# Patient Record
Sex: Female | Born: 1960 | Race: White | Hispanic: No | Marital: Married | Smoking: Never smoker
Health system: Southern US, Community
[De-identification: ages and names within clinical notes are randomized; demographics above are authoritative.]

## PROBLEM LIST (undated history)

## (undated) DIAGNOSIS — R51 Headache: Secondary | ICD-10-CM

## (undated) DIAGNOSIS — I10 Essential (primary) hypertension: Secondary | ICD-10-CM

---

## 2006-07-07 ENCOUNTER — Ambulatory Visit (HOSPITAL_COMMUNITY): Admission: RE | Admit: 2006-07-07 | Discharge: 2006-07-07 | Payer: Self-pay | Admitting: Family Medicine

## 2006-07-14 ENCOUNTER — Encounter: Admission: RE | Admit: 2006-07-14 | Discharge: 2006-07-14 | Payer: Self-pay | Admitting: Family Medicine

## 2007-06-12 IMAGING — MG MM DIAGNOSTIC UNILATERAL L
2 series · 2 of 2 positions shown · non-contrast
Comparison: none

DG DIAGNOSTIC UNILATERAL L
CC and MLO view(s) were taken of the left breast.

LEFT BREAST ULTRASOUND
DIGITAL UNILATERAL LEFT DIAGNOSTIC MAMMOGRAM AND LEFT BREAST ULTRASOUND:
CLINICAL DATA: The patient returns for evaluation of a possible mass in the left breast noted on 
recent screening study of 07-07-06 at [HOSPITAL].

[L CC]
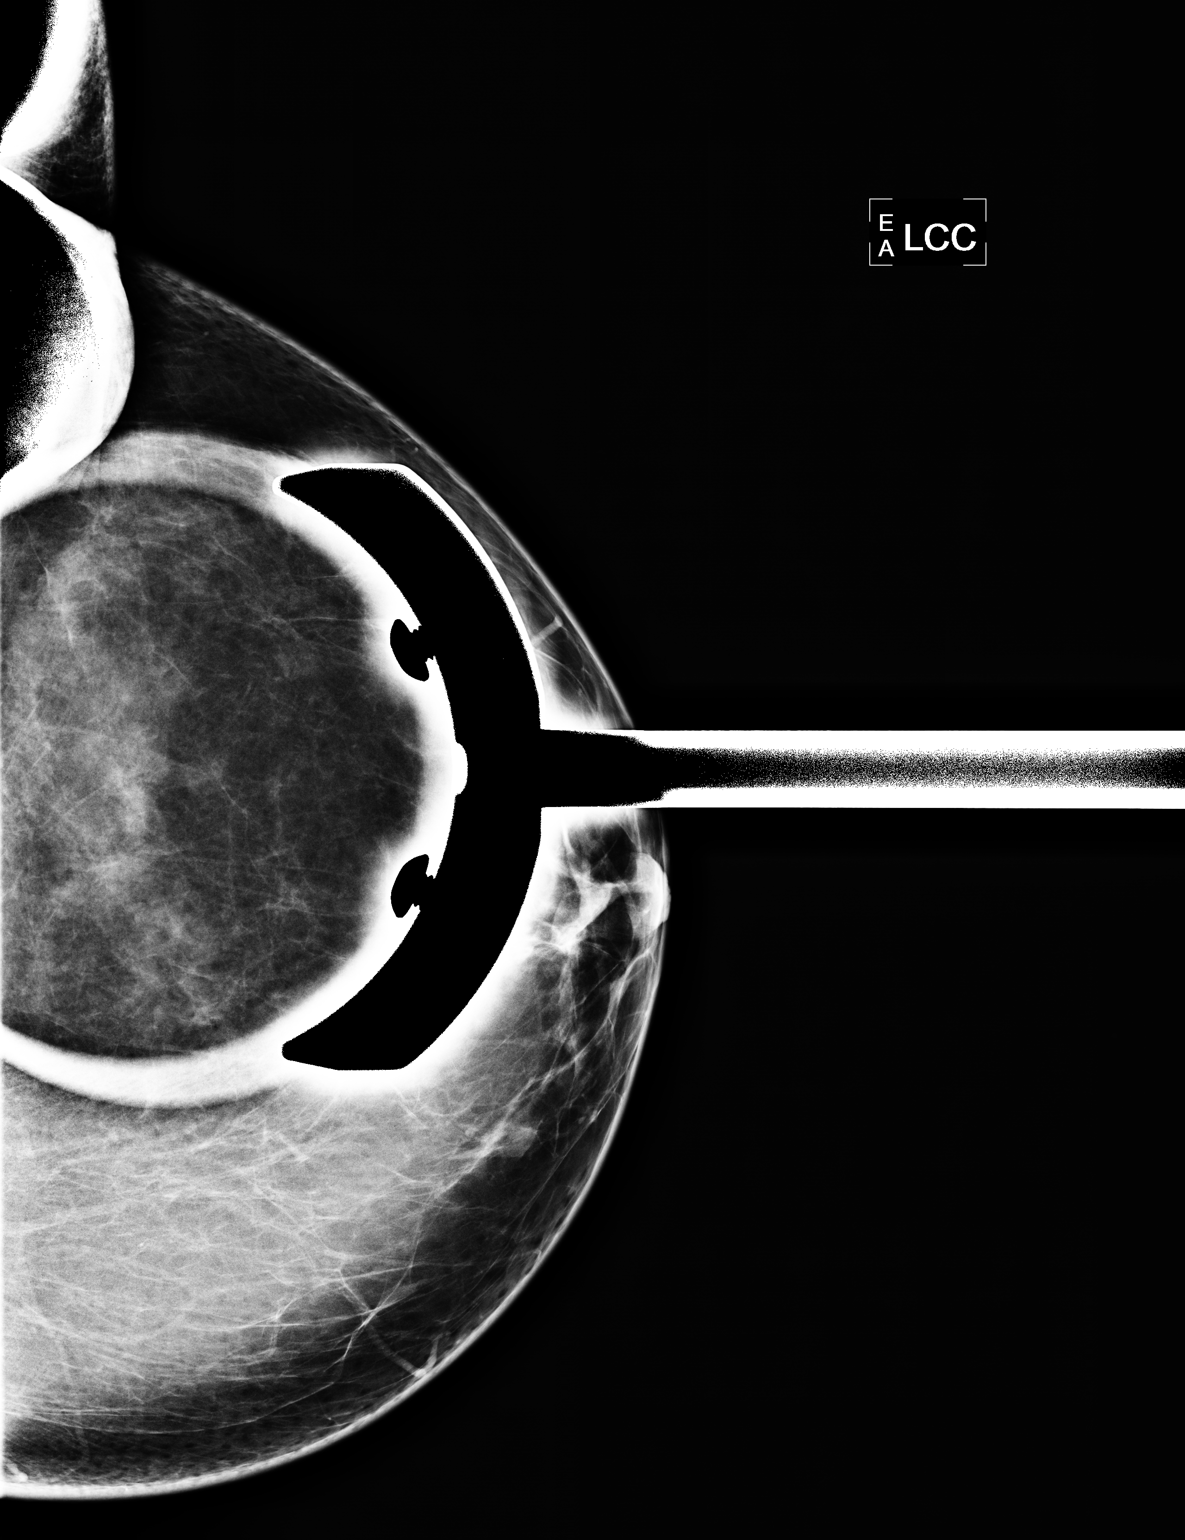

[L MLO]
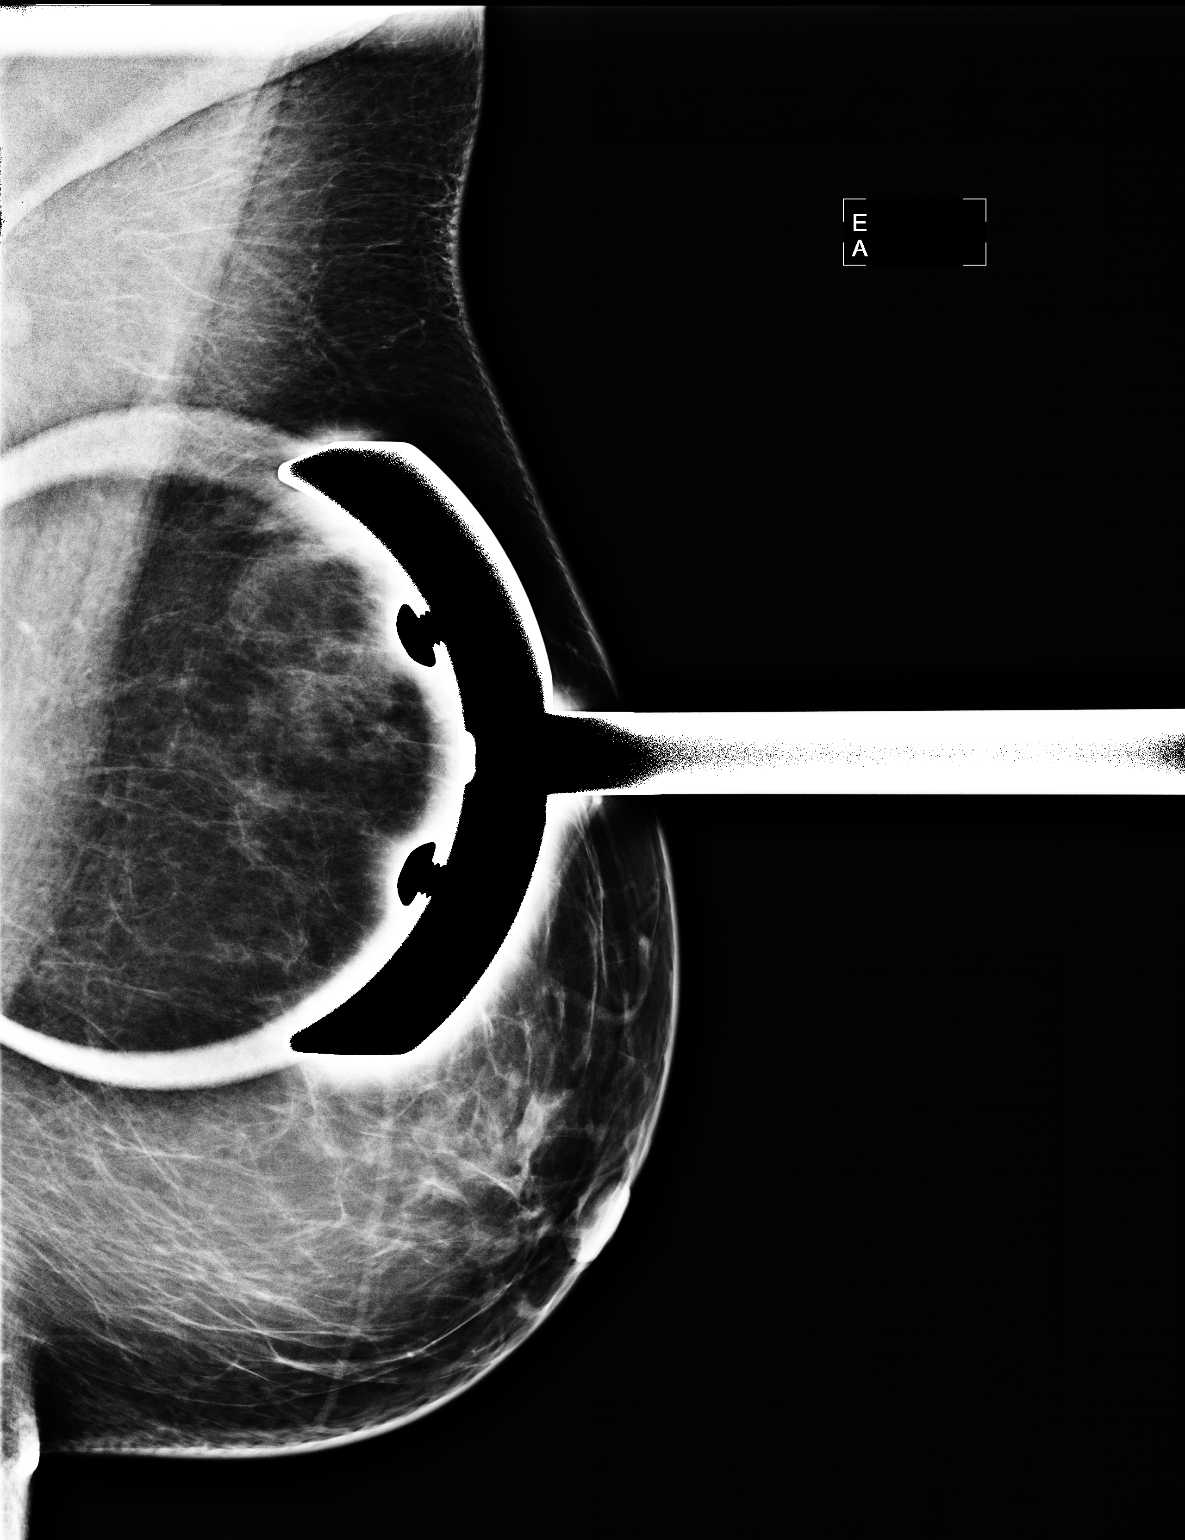

[2 of 2 positions shown; findings below may reference images not displayed]

Spot compression views of the upper outer quadrant of the left breast posteriorly were obtained 
demonstrating no persistent mass or distortion.

On physical examination, no mass is palpated in the outer portion of the left breast.  Sonography 
demonstrates mixed fibroglandular tissue and fat with no mass, distortion or shadowing to suggest 
malignancy.
IMPRESSION: No persistent worrisome abnormality upon additional imaging of the left breast.  Yearly screening 
is suggested.

ASSESSMENT: Negative - BI-RADS 1

Screening mammogram of both breasts in 1 year.
,

## 2008-01-11 ENCOUNTER — Ambulatory Visit (HOSPITAL_COMMUNITY): Admission: RE | Admit: 2008-01-11 | Discharge: 2008-01-11 | Payer: Self-pay | Admitting: Family Medicine

## 2011-01-02 ENCOUNTER — Encounter: Payer: Self-pay | Admitting: Family Medicine

## 2011-03-31 ENCOUNTER — Other Ambulatory Visit (HOSPITAL_COMMUNITY): Payer: Self-pay | Admitting: Family Medicine

## 2011-03-31 DIAGNOSIS — Z139 Encounter for screening, unspecified: Secondary | ICD-10-CM

## 2011-04-07 ENCOUNTER — Ambulatory Visit (HOSPITAL_COMMUNITY): Payer: Self-pay

## 2012-03-29 ENCOUNTER — Other Ambulatory Visit: Payer: Self-pay | Admitting: Family Medicine

## 2012-03-29 DIAGNOSIS — Z139 Encounter for screening, unspecified: Secondary | ICD-10-CM

## 2012-04-02 ENCOUNTER — Ambulatory Visit (HOSPITAL_COMMUNITY)
Admission: RE | Admit: 2012-04-02 | Discharge: 2012-04-02 | Disposition: A | Discharge status: Home / Self Care | Payer: BC Managed Care – PPO | Source: Ambulatory Visit | Attending: Family Medicine | Admitting: Family Medicine

## 2012-04-02 DIAGNOSIS — Z1231 Encounter for screening mammogram for malignant neoplasm of breast: Secondary | ICD-10-CM | POA: Insufficient documentation

## 2012-04-02 DIAGNOSIS — Z139 Encounter for screening, unspecified: Secondary | ICD-10-CM

## 2012-04-10 NOTE — H&P (Signed)
  NTS SOAP Note  Vital Signs:  Vitals as of: 04/10/2012: Systolic 145: Diastolic 82: Heart Rate 73: Temp 67F: Height 58ft 7in: Weight 170Lbs 0 Ounces: OFC Not Entered: Respiratory Rate Not Entered: O2 Saturation Not Entered: Pain Level Not Entered: BMI 27  BMI : 26.63 kg/m2  Subjective: This 51 Years 2 Months old Female presents forscreening TCS.  Never has had a colonoscopy.  No family h/o colon cancer.  Denies GI complaints.  Review of Symptoms:  Constitutional:unremarkable Head:unremarkable Eyes:unremarkable Nose/Mouth/Throat:unremarkable Cardiovascular:unremarkable Respiratory:unremarkable Gastrointestinal:unremarkable Genitourinary:unremarkable Musculoskeletal:unremarkable Skin:unremarkable Hematolgic/Lymphatic:unremarkable Allergic/Immunologic:unremarkable   Past Medical History:Reviewed   Past Medical History  Surgical History: unremarkable Medical Problems:  High Blood pressure Allergies: nkda Medications: enalapril, imipranide   Social History:Reviewed  Social History  Preferred Language: English ([place]) Race:  White Ethnicity: Not Hispanic / Latino Age: 51 Years 2 Months Marital Status:  M Alcohol:  No Recreational drug(s):  No   Smoking Status: Never smoker reviewed on 04/10/2012  Family History:Reviewed   Family History  Is there a family history of:CAD, pancreatic cancer.  No family h/o colon cancer    Objective Information: General:Well appearing, well nourished in no distress. Head:Atraumatic; no masses; no abnormalities Neck:Supple without lymphadenopathy.  Heart:RRR, no murmur or gallop.  Normal S1, S2.  No S3, S4.  Lungs:CTA bilaterally, no wheezes, rhonchi, rales.  Breathing unlabored. Abdomen:Soft, NT/ND, no HSM, no masses. deferred to procedure  Assessment:Need for screening TCS  Diagnosis &amp; Procedure: DiagnosisCode: V76.51, ProcedureCode:  10272,    Plan:Schedule for TCS on 04/24/12.   Patient Education:Alternative treatments to surgery were discussed with patient (and family).Risks and benefits  of procedure were fully explained to the patient (and family) who gave informed consent. Patient/family questions were addressed.  Follow-up:Pending Surgery

## 2012-04-19 ENCOUNTER — Encounter (HOSPITAL_COMMUNITY): Payer: Self-pay

## 2012-04-23 MED ORDER — SODIUM CHLORIDE 0.45 % IV SOLN
Freq: Once | INTRAVENOUS | Status: AC
  Administered 2012-04-24: 1000 mL via INTRAVENOUS

## 2012-04-24 ENCOUNTER — Encounter (HOSPITAL_COMMUNITY)
Admission: RE | Disposition: A | Discharge status: Home / Self Care | Payer: Self-pay | Source: Ambulatory Visit | Attending: General Surgery

## 2012-04-24 ENCOUNTER — Ambulatory Visit (HOSPITAL_COMMUNITY)
Admission: RE | Admit: 2012-04-24 | Discharge: 2012-04-24 | Disposition: A | Discharge status: Home / Self Care | Payer: BC Managed Care – PPO | Source: Ambulatory Visit | Attending: General Surgery | Admitting: General Surgery

## 2012-04-24 ENCOUNTER — Encounter (HOSPITAL_COMMUNITY): Payer: Self-pay | Admitting: *Deleted

## 2012-04-24 DIAGNOSIS — I10 Essential (primary) hypertension: Secondary | ICD-10-CM | POA: Insufficient documentation

## 2012-04-24 DIAGNOSIS — Z1211 Encounter for screening for malignant neoplasm of colon: Secondary | ICD-10-CM | POA: Insufficient documentation

## 2012-04-24 DIAGNOSIS — Z79899 Other long term (current) drug therapy: Secondary | ICD-10-CM | POA: Insufficient documentation

## 2012-04-24 HISTORY — PX: COLONOSCOPY: SHX5424

## 2012-04-24 HISTORY — DX: Headache: R51

## 2012-04-24 HISTORY — DX: Essential (primary) hypertension: I10

## 2012-04-24 SURGERY — COLONOSCOPY
Anesthesia: Moderate Sedation

## 2012-04-24 MED ORDER — MEPERIDINE HCL 100 MG/ML IJ SOLN
INTRAMUSCULAR | Status: AC
  Filled 2012-04-24: qty 1

## 2012-04-24 MED ORDER — STERILE WATER FOR IRRIGATION IR SOLN
Status: DC | PRN
  Administered 2012-04-24: 09:00:00

## 2012-04-24 MED ORDER — MIDAZOLAM HCL 5 MG/5ML IJ SOLN
INTRAMUSCULAR | Status: DC | PRN
  Administered 2012-04-24: 1 mg via INTRAVENOUS
  Administered 2012-04-24: 2 mg via INTRAVENOUS

## 2012-04-24 MED ORDER — MEPERIDINE HCL 25 MG/ML IJ SOLN
INTRAMUSCULAR | Status: DC | PRN
  Administered 2012-04-24: 50 mg via INTRAVENOUS

## 2012-04-24 MED ORDER — MIDAZOLAM HCL 5 MG/5ML IJ SOLN
INTRAMUSCULAR | Status: AC
  Filled 2012-04-24: qty 10

## 2012-04-24 NOTE — Interval H&P Note (Signed)
History and Physical Interval Note:  04/24/2012 8:38 AM  [name]  has presented today for surgery, with the diagnosis of Special screening for malignant neoplasms, colon  The various methods of treatment have been discussed with the patient and family. After consideration of risks, benefits and other options for treatment, the patient has consented to  Procedure(s) (LRB): COLONOSCOPY (N/A) as a surgical intervention .  The patients' history has been reviewed, patient examined, no change in status, stable for surgery.  I have reviewed the patients' chart and labs.  Questions were answered to the patient's satisfaction.     Renee Berger A

## 2012-04-24 NOTE — Op Note (Signed)
[facility] [address] [place], [place]  [address]  COLONOSCOPY PROCEDURE REPORT  PATIENT:  Renee Berger, Renee Berger  MR#:  [id number] BIRTHDATE:  1961/02/15, 51 yrs. old  GENDER:  female ENDOSCOPIST:  Franky Macho, MD REF. BY:  Simone Curia, M.D. PROCEDURE DATE:  04/24/2012 PROCEDURE:  Average-risk screening colonoscopy G0121 ASA CLASS:  Class II INDICATIONS:  Screening MEDICATIONS:   Versed 3 mg IV, demerol 50 mg IV  DESCRIPTION OF PROCEDURE:   After the risks benefits and alternatives of the procedure were thoroughly explained, informed consent was obtained.  Digital rectal exam was performed and revealed no abnormalities.   The EC-[id number] ([id number]) endoscope was introduced through the anus and advanced to the cecum, which was identified by both the appendix and ileocecal valve, without limitations.  The quality of the prep was adequate..  The instrument was then slowly withdrawn as the colon was fully examined.  FINDINGS:  A normal appearing cecum, ileocecal valve, and appendiceal orifice were identified. The ascending, hepatic flexure, transverse, splenic flexure, descending, sigmoid colon, and rectum appeared unremarkable.   Retroflexed views in the rectum revealed no abnormalities.   The scope was then withdrawn from the cecum and the procedure completed. COMPLICATIONS:  None ENDOSCOPIC IMPRESSION: 1) Normal colon RECOMMENDATIONS:  REPEAT EXAM:  In 10 year(s) for Colonoscopy.  ______________________________ Franky Macho, MD  CC:  Simone Curia, Md  n. Rosalie DoctorFranky Macho at 04/24/2012 08:57 AM  Fannie Knee, [phone]

## 2012-04-24 NOTE — Discharge Instructions (Signed)
Colonoscopy Care After Read the instructions outlined below and refer to this sheet in the next few weeks. These discharge instructions provide you with general information on caring for yourself after you leave the hospital. Your doctor may also give you specific instructions. While your treatment has been planned according to the most current medical practices available, unavoidable complications occasionally occur. If you have any problems or questions after discharge, call your doctor. HOME CARE INSTRUCTIONS ACTIVITY:  You may resume your regular activity, but move at a slower pace for the next 24 hours.   Take frequent rest periods for the next 24 hours.   Walking will help get rid of the air and reduce the bloated feeling in your belly (abdomen).   No driving for 24 hours (because of the medicine (anesthesia) used during the test).   You may shower.   Do not sign any important legal documents or operate any machinery for 24 hours (because of the anesthesia used during the test).  NUTRITION:  Drink plenty of fluids.   You may resume your normal diet as instructed by your doctor.   Begin with a light meal and progress to your normal diet. Heavy or fried foods are harder to digest and may make you feel sick to your stomach (nauseated).   Avoid alcoholic beverages for 24 hours or as instructed.  MEDICATIONS:  You may resume your normal medications unless your doctor tells you otherwise.  WHAT TO EXPECT TODAY:  Some feelings of bloating in the abdomen.   Passage of more gas than usual.   Spotting of blood in your stool or on the toilet paper.  IF YOU HAD POLYPS REMOVED DURING THE COLONOSCOPY:  No aspirin products for 7 days or as instructed.   No alcohol for 7 days or as instructed.   Eat a soft diet for the next 24 hours.  FINDING OUT THE RESULTS OF YOUR TEST Not all test results are available during your visit. If your test results are not back during the visit, make an  appointment with your caregiver to find out the results. Do not assume everything is normal if you have not heard from your caregiver or the medical facility. It is important for you to follow up on all of your test results.  SEEK IMMEDIATE MEDICAL CARE IF:  You have more than a spotting of blood in your stool.   Your belly is swollen (abdominal distention).   You are nauseated or vomiting.   You have a fever.   You have abdominal pain or discomfort that is severe or gets worse throughout the day.  Document Released: 07/12/2004 Document Revised: 11/17/2011 Document Reviewed: 07/10/2008 ExitCare Patient Information 2012 [employer]. 

## 2012-04-27 ENCOUNTER — Encounter (HOSPITAL_COMMUNITY): Payer: Self-pay | Admitting: General Surgery

## 2012-07-14 ENCOUNTER — Emergency Department (HOSPITAL_COMMUNITY)
Admission: EM | Admit: 2012-07-14 | Discharge: 2012-07-14 | Disposition: A | Discharge status: Home / Self Care | Payer: BC Managed Care – PPO | Attending: Emergency Medicine | Admitting: Emergency Medicine

## 2012-07-14 ENCOUNTER — Encounter (HOSPITAL_COMMUNITY): Payer: Self-pay | Admitting: *Deleted

## 2012-07-14 DIAGNOSIS — M62838 Other muscle spasm: Secondary | ICD-10-CM

## 2012-07-14 DIAGNOSIS — I10 Essential (primary) hypertension: Secondary | ICD-10-CM | POA: Insufficient documentation

## 2012-07-14 DIAGNOSIS — M549 Dorsalgia, unspecified: Secondary | ICD-10-CM

## 2012-07-14 MED ORDER — DIAZEPAM 5 MG PO TABS: 2.5000 mg | ORAL_TABLET | Freq: Two times a day (BID) | ORAL | Status: AC | PRN

## 2012-07-14 MED ORDER — DIAZEPAM 2 MG PO TABS
2.0000 mg | ORAL_TABLET | Freq: Once | ORAL | Status: AC
  Administered 2012-07-14: 2 mg via ORAL
  Filled 2012-07-14: qty 1

## 2012-07-14 NOTE — ED Provider Notes (Signed)
History   This chart was scribed for [name], MD by Shari Heritage. The patient was seen in room APA19/APA19. Patient's care was started at 0649.     CSN: [id number]  Arrival date & time 07/14/12  4098   First MD Initiated Contact with Patient 07/14/12 [phone]      Chief Complaint  Patient presents with  . Fall  . Back Pain    Patient is a 51 y.o. female presenting with fall and back pain. The history is provided by the patient. No language interpreter was used.  Fall The accident occurred yesterday. The fall occurred while walking. She fell from a height of 1 to 2 ft. Impact surface: Table. Point of impact: Back. Pain location: Back. The pain is moderate. She was ambulatory at the scene. Pertinent negatives include no numbness, no abdominal pain, no nausea, no vomiting, no loss of consciousness and no tingling. She has tried acetaminophen for the symptoms. The treatment provided mild relief.  Back Pain  This is a new problem. The current episode started yesterday. The problem occurs constantly. The problem has been gradually improving. The pain is associated with falling. The pain is present in the lumbar spine. The pain radiates to the right thigh. The pain is moderate. The symptoms are aggravated by bending, twisting and certain positions. Pertinent negatives include no numbness, no abdominal pain and no tingling. Treatments tried: Acetaminophen and Lidocaine patch. The treatment provided mild relief.    Renee Berger is a 51 y.o. female who presents to the Emergency Department complaining of moderate, persistent right lower back pain and right leg pain resulting from a fall that occurred yesterday night. Patient states that she tripped, fell backwards and hit her dining room table. She has taken Tylenol and applied a Lidocaine pain patch to mild relief. She says that the pain has gradually lessened, but is still present. Patient denies LOC, weakness of BLE, neck pain, chest pain or  abdominal pain. Patient has a history of HTN, but does not take any anticoagulants. She takes Elanapril and Indapamide. Patient has a surgical history of colonoscopy. She has never smoked.  Past Medical History  Diagnosis Date  . Hypertension   . Headache     Past Surgical History  Procedure Date  . Colonoscopy 04/24/2012    Procedure: COLONOSCOPY;  Surgeon: Dalia Heading, MD;  Location: [facility];  Service: Gastroenterology;  Laterality: N/A;    No family history on file.  History  Substance Use Topics  . Smoking status: Never Smoker   . Smokeless tobacco: Not on file  . Alcohol Use: No    OB History    Grav Para Term Preterm Abortions TAB SAB Ect Mult Living                  Review of Systems  Gastrointestinal: Negative for nausea, vomiting and abdominal pain.  Musculoskeletal: Positive for back pain.  Neurological: Negative for tingling, loss of consciousness and numbness.  All other systems reviewed and are negative.    Allergies  Review of patient's allergies indicates no known allergies.  Home Medications   Current Outpatient Rx  Name Route Sig Dispense Refill  . CALCIUM 600 + D PO Oral Take 1 tablet by mouth daily.    . ENALAPRIL MALEATE 10 MG PO TABS Oral Take 10 mg by mouth daily.    . OMEGA-3 FATTY ACIDS 1000 MG PO CAPS Oral Take 1 g by mouth daily.    Marland Kitchen  INDAPAMIDE 2.5 MG PO TABS Oral Take 2.5 mg by mouth daily.    . RED YEAST RICE PO Oral Take 1 tablet by mouth 2 (two) times daily.    Marland Kitchen VITAMIN E 400 UNITS PO CAPS Oral Take 400 Units by mouth daily.      BP 124/75  Pulse 67  Temp 98.1 F (36.7 C)  Resp 16  SpO2 98%  Physical Exam CONSTITUTIONAL: Well developed/well nourished HEAD AND FACE: Normocephalic/atraumatic EYES: EOMI/PERRL ENMT: Mucous membranes moist NECK: supple no meningeal signs SPINE:entire spine nontender, No bruising/crepitance/stepoffs noted to spine CV: S1/S2 noted, no murmurs/rubs/gallops noted LUNGS: Lungs are clear  to auscultation bilaterally, no apparent distress Chest - nontender, no posterior wall tenderness ABDOMEN: soft, nontender, no rebound or guarding GU: Right lower flank tenderness/pain.  No bruising is noted.  No crepitance is noted NEURO: Pt is awake/alert, moves all extremitiesx4. Patient is able to ambulate.  No focal motor deficits noted EXTREMITIES: pulses normal, full ROM, no deformity, no tenderness SKIN: warm, color normal PSYCH: no abnormalities of mood noted   ED Course  Procedures  DIAGNOSTIC STUDIES: Oxygen Saturation is 98% on room air, normal by my interpretation.    COORDINATION OF CARE: 7:10am- Patient informed of current plan for treatment and evaluation and agrees with plan at this time. Will administer 1 tablet of Valium 2 mg and discharge patient home with a prescription for Valium 2.5 mg to take every 12 hours as needed.  We discussed that imaging not necesary at this time as no bony tenderness.  I doubt renal/abdominal injury and no signs of chest wall injury.  No spinal tenderness noted.  We discussed strict return precautions (worsened pain, weakness in legs, abdominal pain, weakness) Pt agreeable    MDM  Nursing notes including past medical history and social history reviewed and considered in documentation       I personally performed the services described in this documentation, which was scribed in my presence. The recorded information has been reviewed and considered.      [name], MD 07/14/12 [phone]

## 2012-07-14 NOTE — ED Notes (Signed)
Pt states she tripped and fell backward last night, hitting right lower back on dining room table.

## 2013-05-09 ENCOUNTER — Ambulatory Visit (HOSPITAL_COMMUNITY)
Admission: RE | Admit: 2013-05-09 | Discharge: 2013-05-09 | Disposition: A | Discharge status: Home / Self Care | Payer: BC Managed Care – PPO | Source: Ambulatory Visit | Attending: Family Medicine | Admitting: Family Medicine

## 2013-05-09 ENCOUNTER — Other Ambulatory Visit (HOSPITAL_COMMUNITY): Payer: Self-pay | Admitting: *Deleted

## 2013-05-09 DIAGNOSIS — R0602 Shortness of breath: Secondary | ICD-10-CM

## 2013-06-19 ENCOUNTER — Ambulatory Visit (INDEPENDENT_AMBULATORY_CARE_PROVIDER_SITE_OTHER): Payer: BC Managed Care – PPO | Admitting: Family Medicine

## 2013-06-19 ENCOUNTER — Encounter: Payer: Self-pay | Admitting: Family Medicine

## 2013-06-19 VITALS — BP 130/80 | HR 80 | Temp 98.2°F | Wt 174.0 lb

## 2013-06-19 DIAGNOSIS — I10 Essential (primary) hypertension: Secondary | ICD-10-CM

## 2013-06-19 DIAGNOSIS — R079 Chest pain, unspecified: Secondary | ICD-10-CM

## 2013-06-19 DIAGNOSIS — J309 Allergic rhinitis, unspecified: Secondary | ICD-10-CM

## 2013-06-19 NOTE — Progress Notes (Signed)
  Subjective:    Patient ID: Renee Berger, female    DOB: [birth year], 52 y.o.   MRN: [id number]  HPI  Cough and congestion. c p with exertion. Sharp pain with exertion.  Huffing and pufgin with exertion. Developed with running. Sharp no sig change with each breath patient concerned about frequent difficulties breathing. At times chest pain is sharp and transient sometimes in the right side sometimes on the left.  Patient is compliant with her blood pressure medicine. [place] nurse practitioner and it has been in good range.  Patient had a spell of fairly severe pain right sided deep ache that occurred when she was running a 5K race. Of note she ran through this pain and eventually it faded at the end of the race.  Allergies somewhat better on singulair and allegra  Review of Systems     no drainage compliant with medicines. No headache no abdominal pain no change in urinary or bowel habits. Objective:   Physical Exam  Alert no acute distress. HEENT normal. Slight nasal congestion most. Lungs clear. Heart regular rate and rhythm. Abdomen benign.  EKG within normal limits.      Assessment & Plan:  Impression #1 hypertension good control. #2 dyspnea often associated with exertion but at times not nonspecific discussed. #3 chest pain highly doubt cardiac with normal heart exam normal EKG very high HDL and very atypical nature of discomfort. #4 allergic rhinitis stable on the current meds. Plan pulmonary function tests. Further orders as noted. Maintain usual meds. Diet exercise discussed. Followup for discussion of results. WSL

## 2013-06-20 ENCOUNTER — Ambulatory Visit (HOSPITAL_COMMUNITY)
Admission: RE | Admit: 2013-06-20 | Discharge: 2013-06-20 | Disposition: A | Discharge status: Home / Self Care | Payer: BC Managed Care – PPO | Source: Ambulatory Visit | Attending: Family Medicine | Admitting: Family Medicine

## 2013-06-20 DIAGNOSIS — R071 Chest pain on breathing: Secondary | ICD-10-CM | POA: Insufficient documentation

## 2013-06-20 MED ORDER — ALBUTEROL SULFATE (5 MG/ML) 0.5% IN NEBU
2.5000 mg | INHALATION_SOLUTION | Freq: Once | RESPIRATORY_TRACT | Status: AC
  Administered 2013-06-20: 2.5 mg via RESPIRATORY_TRACT

## 2013-06-21 NOTE — Procedures (Signed)
Renee Berger, Renee Berger                ACCOUNT NO.:  [id number]  MEDICAL RECORD NO.:  [id number]  LOCATION:  RESP                          FACILITY:  [facility]  PHYSICIAN:  Joe Gee L. Juanetta Gosling, M.D.DATE OF BIRTH:  07-16-1961  DATE OF PROCEDURE: DATE OF DISCHARGE:  06/20/2013                           PULMONARY FUNCTION TEST   REASON FOR PULMONARY FUNCTION TESTING:  Chest pain on exertion. 1. Spirometry is normal. 2. There is no improvement post bronchodilator. 3. This is a normal spirometry and does not show a cause of chest     pain.     Wen Merced L. Juanetta Gosling, M.D.     ELH/MEDQ  D:  06/20/2013  T:  06/21/2013  Job:  [id number]  cc:   Donna Bernard, M.D. Fax: [phone]

## 2013-06-23 DIAGNOSIS — I10 Essential (primary) hypertension: Secondary | ICD-10-CM | POA: Insufficient documentation

## 2013-06-23 DIAGNOSIS — J309 Allergic rhinitis, unspecified: Secondary | ICD-10-CM | POA: Insufficient documentation

## 2013-06-26 ENCOUNTER — Other Ambulatory Visit: Payer: Self-pay | Admitting: *Deleted

## 2013-06-26 MED ORDER — INDAPAMIDE 2.5 MG PO TABS: 2.5000 mg | ORAL_TABLET | Freq: Every day | ORAL | Status: DC

## 2013-06-27 ENCOUNTER — Encounter: Payer: Self-pay | Admitting: Family Medicine

## 2013-06-27 ENCOUNTER — Ambulatory Visit (INDEPENDENT_AMBULATORY_CARE_PROVIDER_SITE_OTHER): Payer: BC Managed Care – PPO | Admitting: Family Medicine

## 2013-06-27 VITALS — BP 104/70 | Temp 98.2°F | Wt 174.0 lb

## 2013-06-27 DIAGNOSIS — J45909 Unspecified asthma, uncomplicated: Secondary | ICD-10-CM

## 2013-06-27 DIAGNOSIS — J683 Other acute and subacute respiratory conditions due to chemicals, gases, fumes and vapors: Secondary | ICD-10-CM

## 2013-06-27 NOTE — Progress Notes (Signed)
  Subjective:    Patient ID: Renee Berger, female    DOB: 08-May-1961, 52 y.o.   MRN: [id number]  HPI Patient arrives office for followup. States overall doing reasonably well. Has had no further spells of significant chest pain.  Has not had any spells of wheezing either.  Patient reports allergies overall are stable. She has not been able to get her blood work yet.   Review of Systems ROS otherwise negative.    Objective:   Physical Exam  Alert no acute distress. HEENT normal. Lungs clear. Heart regular rate and rhythm.      Assessment & Plan:  Impression allergic rhinitis with element of reactive airways discussed pulmonary function test did not reveal true asthma. #2 hypertension stable plan diet exercise discussed in encourage. Maintain same meds. Try off Singulair later this summer.

## 2013-06-30 DIAGNOSIS — J683 Other acute and subacute respiratory conditions due to chemicals, gases, fumes and vapors: Secondary | ICD-10-CM | POA: Insufficient documentation

## 2013-09-23 ENCOUNTER — Other Ambulatory Visit: Payer: Self-pay

## 2013-09-23 MED ORDER — INDAPAMIDE 2.5 MG PO TABS: 2.5000 mg | ORAL_TABLET | Freq: Every day | ORAL | Status: DC

## 2013-10-17 ENCOUNTER — Other Ambulatory Visit: Payer: Self-pay

## 2013-10-24 ENCOUNTER — Other Ambulatory Visit: Payer: Self-pay | Admitting: Family Medicine

## 2013-11-22 ENCOUNTER — Encounter: Payer: Self-pay | Admitting: Nurse Practitioner

## 2013-11-22 ENCOUNTER — Ambulatory Visit (INDEPENDENT_AMBULATORY_CARE_PROVIDER_SITE_OTHER): Payer: BC Managed Care – PPO | Admitting: Nurse Practitioner

## 2013-11-22 VITALS — BP 128/88 | Temp 97.7°F | Ht 67.0 in | Wt 180.4 lb

## 2013-11-22 DIAGNOSIS — J45909 Unspecified asthma, uncomplicated: Secondary | ICD-10-CM

## 2013-11-22 DIAGNOSIS — J683 Other acute and subacute respiratory conditions due to chemicals, gases, fumes and vapors: Secondary | ICD-10-CM

## 2013-11-22 MED ORDER — ALBUTEROL SULFATE HFA 108 (90 BASE) MCG/ACT IN AERS: 2.0000 | INHALATION_SPRAY | RESPIRATORY_TRACT | Status: DC | PRN

## 2013-11-22 MED ORDER — FLUTICASONE PROPIONATE HFA 110 MCG/ACT IN AERO: 1.0000 | INHALATION_SPRAY | Freq: Two times a day (BID) | RESPIRATORY_TRACT | Status: DC

## 2013-11-25 ENCOUNTER — Encounter: Payer: Self-pay | Admitting: Nurse Practitioner

## 2013-11-25 NOTE — Progress Notes (Signed)
Subjective:  Presents complaints of chest tightness and difficulty taking a deep breath that began after exposure to a large amount of air freshener spray in her home about a week ago. Has a history of reactive airways to environmental irritants. Symptoms were better after her inhaler was used. Last used her inhaler several hours ago. Mild head congestion. Occasional cough. Slight sore throat. No headache. No fever. No acid reflux.  Objective:   BP 128/88  Temp(Src) 97.7 F (36.5 C) (Oral)  Ht 5\' 7"  (1.702 m)  Wt 180 lb 6.4 oz (81.829 kg)  BMI 28.25 kg/m2 NAD. Alert, oriented. TMs minimal clear effusion, no erythema. Pharynx clear. Neck supple with minimal adenopathy. Lungs minimally diminished breath sounds, no wheezing or tachypnea. No congestion noted. Heart regular rate rhythm.  Assessment:Reactive airway disease   Plan: Meds ordered this encounter  Medications  . albuterol (PROVENTIL HFA;VENTOLIN HFA) 108 (90 BASE) MCG/ACT inhaler    Sig: Inhale 2 puffs into the lungs every 4 (four) hours as needed for wheezing or shortness of breath.    Dispense:  1 Inhaler    Refill:  2    Order Specific Question:  Supervising Provider    Answer:  Merlyn Albert [[id number]]  . fluticasone (FLOVENT HFA) 110 MCG/ACT inhaler    Sig: Inhale 1 puff into the lungs 2 (two) times daily.    Dispense:  1 Inhaler    Refill:  11    Order Specific Question:  Supervising Provider    Answer:  Riccardo Dubin   Will add Flovent her regimen to see if we can cut back on her reactive airways. Patient understands difference between albuterol and Flovent. Continue antihistamine and Singulair. To keep her albuterol inhaler with her at all times, she enters different environments especially with her job. Call back if symptoms worsen or persist.

## 2013-11-25 NOTE — Assessment & Plan Note (Signed)
.   albuterol (PROVENTIL HFA;VENTOLIN HFA) 108 (90 BASE) MCG/ACT inhaler    Sig: Inhale 2 puffs into the lungs every 4 (four) hours as needed for wheezing or shortness of breath.    Dispense:  1 Inhaler    Refill:  2    Order Specific Question:  Supervising Provider    Answer:  Merlyn Albert [[id number]]  . fluticasone (FLOVENT HFA) 110 MCG/ACT inhaler    Sig: Inhale 1 puff into the lungs 2 (two) times daily.    Dispense:  1 Inhaler    Refill:  11    Order Specific Question:  Supervising Provider    Answer:  Riccardo Dubin   Will add Flovent her regimen to see if we can cut back on her reactive airways. Patient understands difference between albuterol and Flovent. Continue antihistamine and Singulair. To keep her albuterol inhaler with her at all times, she enters different environments especially with her job. Call back if symptoms worsen or persist.

## 2013-12-18 ENCOUNTER — Other Ambulatory Visit: Payer: Self-pay | Admitting: Family Medicine

## 2013-12-21 ENCOUNTER — Other Ambulatory Visit: Payer: Self-pay | Admitting: Family Medicine

## 2014-03-12 ENCOUNTER — Other Ambulatory Visit: Payer: Self-pay | Admitting: Family Medicine

## 2014-04-07 IMAGING — CR DG CHEST 2V
2 series · 2 of 2 positions shown · non-contrast
Comparison: None.

CLINICAL DATA: Shortness of breath.

CHEST - 2 VIEW

[view not recorded (1 of 2)]
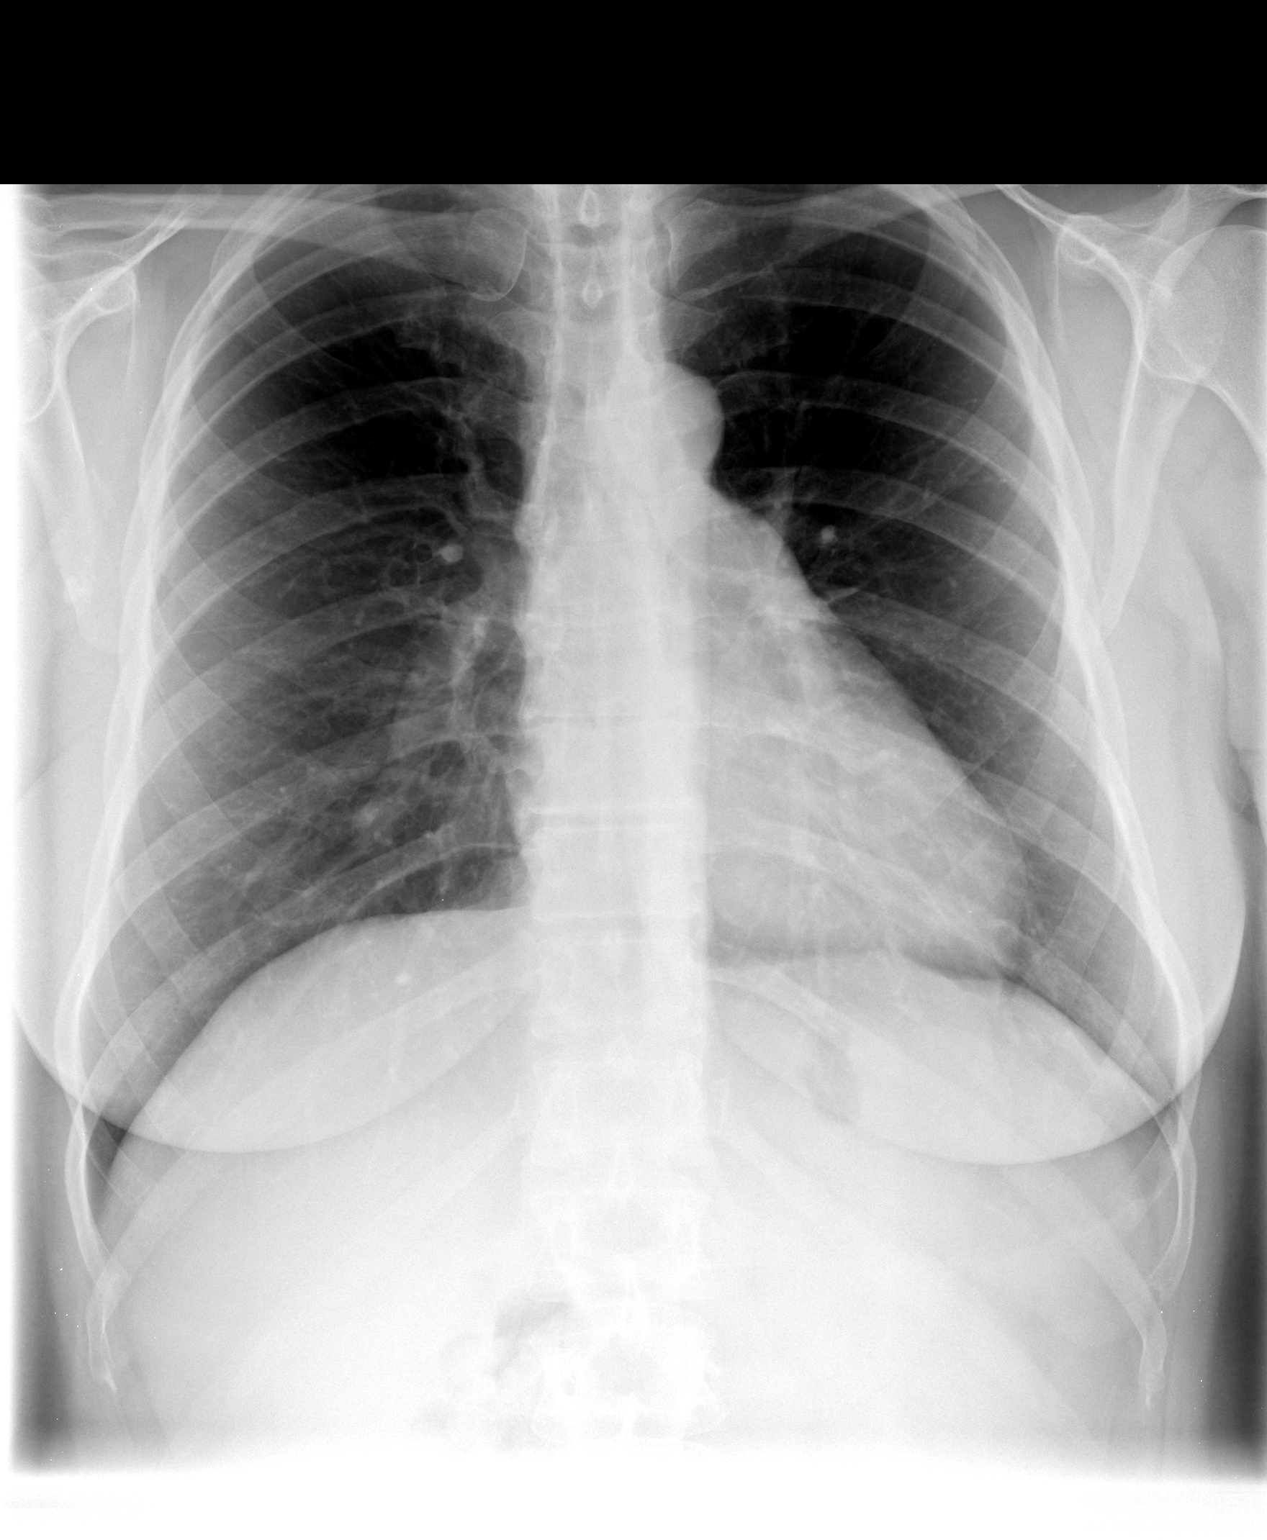

[view not recorded (2 of 2)]
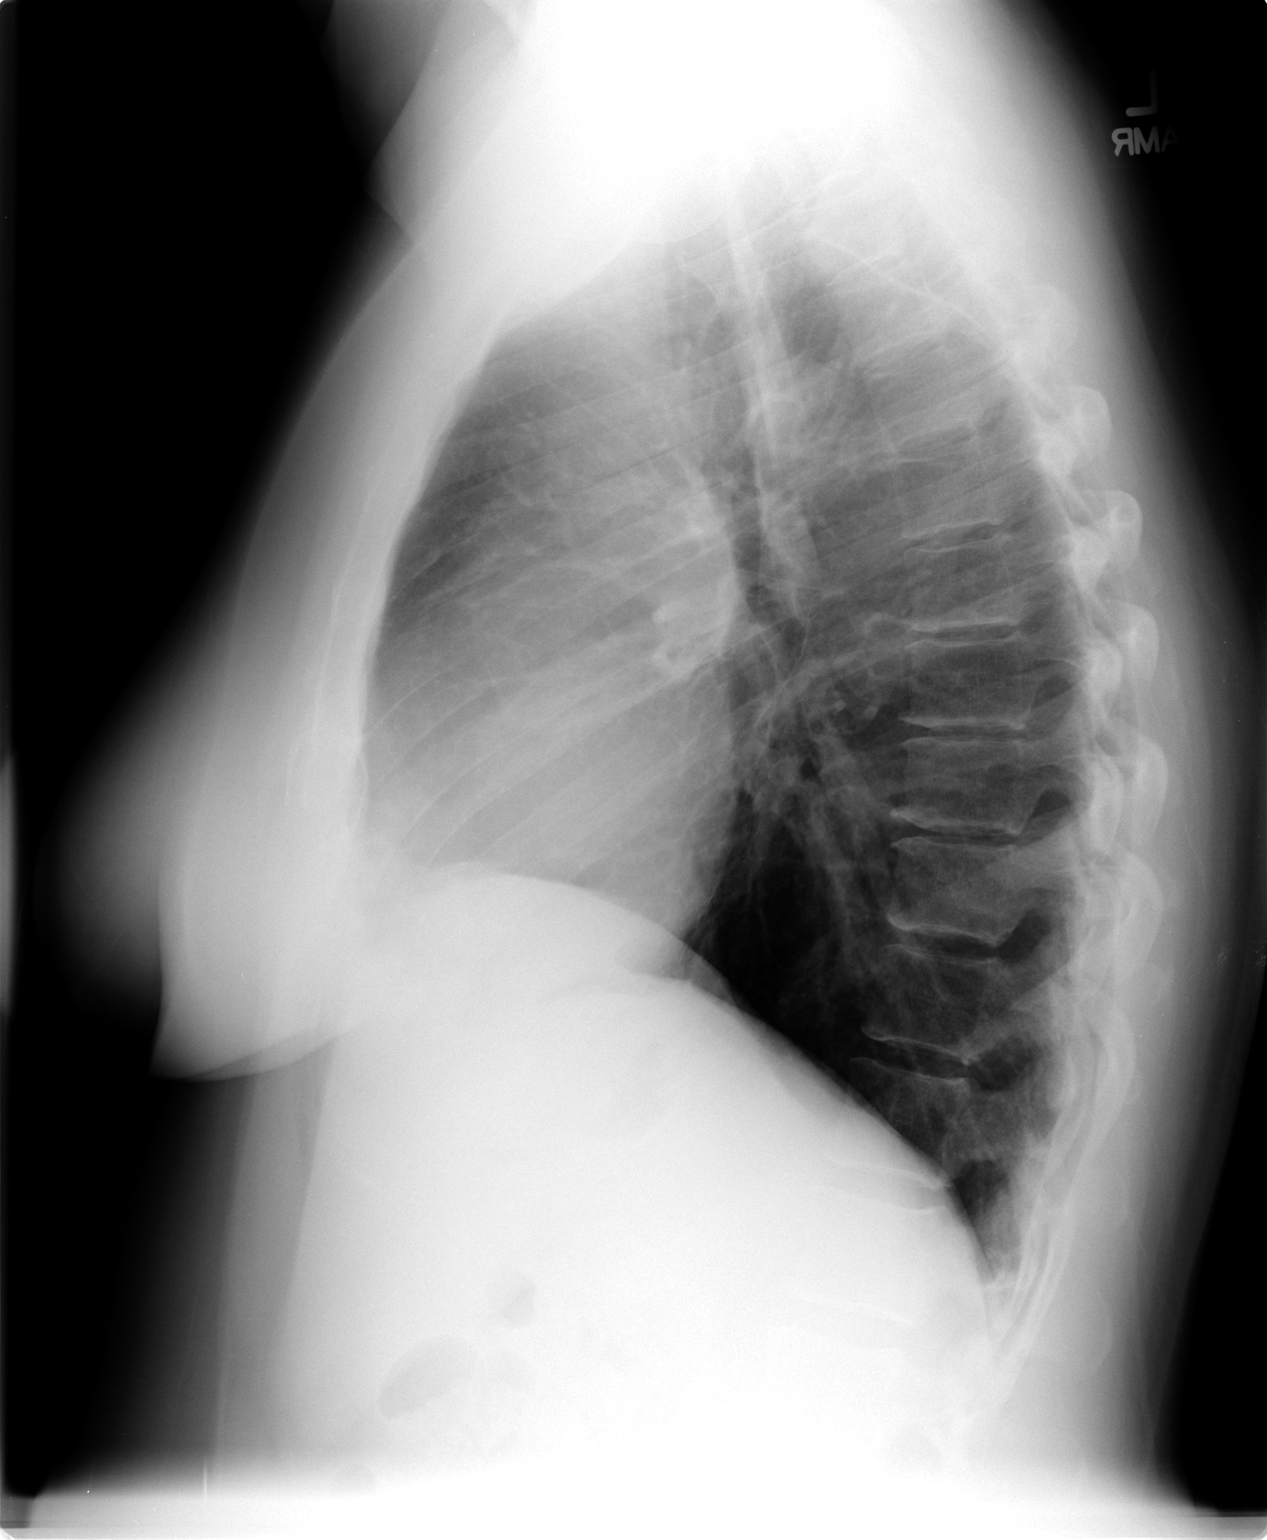

[2 of 2 positions shown; findings below may reference images not displayed]

FINDINGS: Heart and mediastinal contours are within normal limits.
No focal opacities or effusions.  No acute bony abnormality.
IMPRESSION: Negative.

## 2014-04-08 ENCOUNTER — Telehealth: Payer: Self-pay | Admitting: Family Medicine

## 2014-04-08 ENCOUNTER — Other Ambulatory Visit: Payer: Self-pay | Admitting: *Deleted

## 2014-04-08 MED ORDER — INDAPAMIDE 2.5 MG PO TABS: ORAL_TABLET | ORAL | Status: DC

## 2014-04-08 MED ORDER — ENALAPRIL MALEATE 10 MG PO TABS: ORAL_TABLET | ORAL | Status: DC

## 2014-04-08 NOTE — Telephone Encounter (Signed)
Patient needs a refill of her enalapril (VASOTEC) 10 MG tablet and indapamide (LOZOL) 2.5 MG tablet.   [facility]

## 2014-04-08 NOTE — Telephone Encounter (Signed)
Patient notified

## 2014-04-18 ENCOUNTER — Encounter: Payer: Self-pay | Admitting: Nurse Practitioner

## 2014-04-18 ENCOUNTER — Ambulatory Visit (INDEPENDENT_AMBULATORY_CARE_PROVIDER_SITE_OTHER): Payer: BC Managed Care – PPO | Admitting: Nurse Practitioner

## 2014-04-18 VITALS — BP 128/80 | Ht 67.0 in | Wt 177.0 lb

## 2014-04-18 DIAGNOSIS — J309 Allergic rhinitis, unspecified: Secondary | ICD-10-CM

## 2014-04-18 DIAGNOSIS — J45909 Unspecified asthma, uncomplicated: Secondary | ICD-10-CM

## 2014-04-18 DIAGNOSIS — I10 Essential (primary) hypertension: Secondary | ICD-10-CM

## 2014-04-18 DIAGNOSIS — J0110 Acute frontal sinusitis, unspecified: Secondary | ICD-10-CM

## 2014-04-18 DIAGNOSIS — J683 Other acute and subacute respiratory conditions due to chemicals, gases, fumes and vapors: Secondary | ICD-10-CM

## 2014-04-18 DIAGNOSIS — K219 Gastro-esophageal reflux disease without esophagitis: Secondary | ICD-10-CM

## 2014-04-18 MED ORDER — ENALAPRIL MALEATE 10 MG PO TABS: ORAL_TABLET | ORAL | Status: DC

## 2014-04-18 MED ORDER — AZITHROMYCIN 250 MG PO TABS: ORAL_TABLET | ORAL | Status: DC

## 2014-04-18 MED ORDER — PREDNISONE 20 MG PO TABS: ORAL_TABLET | ORAL | Status: DC

## 2014-04-18 MED ORDER — INDAPAMIDE 2.5 MG PO TABS: ORAL_TABLET | ORAL | Status: DC

## 2014-04-21 ENCOUNTER — Encounter: Payer: Self-pay | Admitting: Nurse Practitioner

## 2014-04-21 NOTE — Progress Notes (Signed)
Subjective:  Presents for routine followup of hypertension and asthma. Sees Dr. Tenny Crawoss her gynecologist for her physicals. Has routine lab work done through her employer. No copy available today. Has had a slight flareup of her asthma over the past week, increase use of her albuterol inhaler up to 2 times per day which helps her breathing. Slight chest pain and different areas of the chest mainly with deep breath or cough. No chest pain with activity. Frequent nonproductive cough. No sore throat or ear pain. Frontal area headache for the past 6 days. Occasional mild reflux depending on foods that she eats. No regular exercise. No changes in her diet, minimal change in her weight. No edema.  Objective:   BP 128/80  Ht 5\' 7"  (1.702 m)  Wt 177 lb (80.287 kg)  BMI 27.72 kg/m2 NAD. Alert, oriented. TMs retracted bilateral, no erythema. Pharynx injected with slightly green PND noted. Neck supple with mild soft anterior adenopathy. Lungs clear. Heart regular rate rhythm. Abdomen soft nondistended nontender.  Assessment: Essential hypertension, benign  Reactive airways dysfunction syndrome/asthma  Allergic rhinitis  Acute frontal sinusitis  GERD (gastroesophageal reflux disease) stable  Plan: Meds ordered this encounter  Medications  . indapamide (LOZOL) 2.5 MG tablet    Sig: TAKE ONE TABLET BY MOUTH ONCE DAILY    Dispense:  90 tablet    Refill:  1    Order Specific Question:  Supervising Provider    Answer:  Merlyn AlbertLUKING, WILLIAM S [[id number]]  . enalapril (VASOTEC) 10 MG tablet    Sig: TAKE ONE TABLET BY MOUTH ONCE DAILY    Dispense:  90 tablet    Refill:  1    Order Specific Question:  Supervising Provider    Answer:  Merlyn AlbertLUKING, WILLIAM S [[id number]]  . azithromycin (ZITHROMAX Z-PAK) 250 MG tablet    Sig: Take 2 tablets (500 mg) on  Day 1,  followed by 1 tablet (250 mg) once daily on Days 2 through 5.    Dispense:  6 each    Refill:  0    Order Specific Question:  Supervising Provider    Answer:   Merlyn AlbertLUKING, WILLIAM S [[id number]]  . predniSONE (DELTASONE) 20 MG tablet    Sig: 3 po qd x 3 d then 2 po qd x 3 d then 1 po qd x 3 d    Dispense:  18 tablet    Refill:  0    Order Specific Question:  Supervising Provider    Answer:  Merlyn AlbertLUKING, WILLIAM S [[id number]]   Continue weight loss efforts. Encourage regular activity. Request that patient bring copy of her lab work to next visit. Given prescription for prednisone in case it is needed over the weekend. Call back if symptoms worsen or persist. Return in about 6 months (around 10/19/2014).

## 2014-10-28 ENCOUNTER — Other Ambulatory Visit: Payer: Self-pay | Admitting: Nurse Practitioner

## 2014-12-17 ENCOUNTER — Encounter: Payer: BC Managed Care – PPO | Admitting: Family Medicine

## 2014-12-22 ENCOUNTER — Encounter: Payer: Self-pay | Admitting: Family Medicine

## 2015-01-05 ENCOUNTER — Encounter: Payer: Self-pay | Admitting: Family Medicine

## 2015-01-05 ENCOUNTER — Ambulatory Visit (INDEPENDENT_AMBULATORY_CARE_PROVIDER_SITE_OTHER): Payer: BLUE CROSS/BLUE SHIELD | Admitting: Family Medicine

## 2015-01-05 VITALS — BP 148/92 | Ht 67.0 in | Wt 183.0 lb

## 2015-01-05 DIAGNOSIS — Z0000 Encounter for general adult medical examination without abnormal findings: Secondary | ICD-10-CM

## 2015-01-05 DIAGNOSIS — J4520 Mild intermittent asthma, uncomplicated: Secondary | ICD-10-CM

## 2015-01-05 DIAGNOSIS — J683 Other acute and subacute respiratory conditions due to chemicals, gases, fumes and vapors: Secondary | ICD-10-CM

## 2015-01-05 DIAGNOSIS — E785 Hyperlipidemia, unspecified: Secondary | ICD-10-CM

## 2015-01-05 DIAGNOSIS — Z111 Encounter for screening for respiratory tuberculosis: Secondary | ICD-10-CM

## 2015-01-05 DIAGNOSIS — I10 Essential (primary) hypertension: Secondary | ICD-10-CM

## 2015-01-05 MED ORDER — INDAPAMIDE 2.5 MG PO TABS: 2.5000 mg | ORAL_TABLET | Freq: Every day | ORAL | Status: DC

## 2015-01-05 MED ORDER — ENALAPRIL MALEATE 10 MG PO TABS: 10.0000 mg | ORAL_TABLET | Freq: Every day | ORAL | Status: DC

## 2015-01-05 MED ORDER — ALBUTEROL SULFATE HFA 108 (90 BASE) MCG/ACT IN AERS: 2.0000 | INHALATION_SPRAY | RESPIRATORY_TRACT | Status: DC | PRN

## 2015-01-05 MED ORDER — FLUTICASONE PROPIONATE HFA 110 MCG/ACT IN AERO: 1.0000 | INHALATION_SPRAY | Freq: Two times a day (BID) | RESPIRATORY_TRACT | Status: DC

## 2015-01-05 NOTE — Progress Notes (Signed)
   Subjective:    Patient ID: Renee Berger, female    DOB: 03-Sep-1961, 54 y.o.   MRN: [id number]  HPI The patient comes in today for a wellness visit.  A review of their health history was completed.  A review of medications was also completed.  Any needed refills: No  Eating habits: Health conscious  Falls/  MVA accidents in past few months: No  Regular exercise: Yes  Specialist pt sees on regular basis: No  Preventative health issues were discussed.   Additional concerns: Needs form filled out for work.   bp control good. Patient compliant with medication. No obvious side effects.  Also compliant with lipid medicines. Claims blood work being followed by a Engineer, civil (consulting)nurse at work place.  The patient uses the inhaler a couple times per week this time of year. Cold air seems to flared up. Does have a mild wheezy tendency.  Allergies under control with current medications.  uses  Review of Systems No headache no chest pain no back pain no shortness of breath no cough ROS otherwise negative    Objective:   Physical Exam Alert blood pressure improved on repeat 140/82. Lungs clear. Heart regular rate and rhythm.       Assessment & Plan:  Impression 1 hypertension good control #2 hyperlipidemia apparent good control #3 reactive airways clinically stable. #4 allergic rhinitis stable discussed. plan form filled out. Follow-up as scheduled. Diet exercise discussed. WSL

## 2015-01-06 DIAGNOSIS — E785 Hyperlipidemia, unspecified: Secondary | ICD-10-CM | POA: Insufficient documentation

## 2015-01-07 LAB — TB SKIN TEST
Induration: 0 mm
TB Skin Test: NEGATIVE

## 2015-01-08 ENCOUNTER — Telehealth: Payer: Self-pay | Admitting: Family Medicine

## 2015-01-08 NOTE — Telephone Encounter (Signed)
See chart for bw from pts work labs

## 2015-03-24 ENCOUNTER — Ambulatory Visit (INDEPENDENT_AMBULATORY_CARE_PROVIDER_SITE_OTHER): Payer: BLUE CROSS/BLUE SHIELD | Admitting: Family Medicine

## 2015-03-24 ENCOUNTER — Encounter: Payer: Self-pay | Admitting: Family Medicine

## 2015-03-24 VITALS — BP 100/66 | Temp 98.4°F | Ht 67.0 in | Wt 175.2 lb

## 2015-03-24 DIAGNOSIS — J209 Acute bronchitis, unspecified: Secondary | ICD-10-CM

## 2015-03-24 MED ORDER — MECLIZINE HCL 25 MG PO TABS: 25.0000 mg | ORAL_TABLET | Freq: Three times a day (TID) | ORAL | Status: DC | PRN

## 2015-03-24 MED ORDER — CEFDINIR 300 MG PO CAPS: 300.0000 mg | ORAL_CAPSULE | Freq: Two times a day (BID) | ORAL | Status: DC

## 2015-03-24 MED ORDER — PREDNISONE 20 MG PO TABS: ORAL_TABLET | ORAL | Status: DC

## 2015-03-24 MED ORDER — PROPANTHELINE BROMIDE 15 MG PO TABS: ORAL_TABLET | ORAL | Status: DC

## 2015-03-24 NOTE — Progress Notes (Signed)
   Subjective:    Patient ID: Renee Berger, female    DOB: 1961/06/21, 54 y.o.   MRN: [id number]  URI  This is a new problem. The current episode started in the past 7 days. The problem has been unchanged. The maximum temperature recorded prior to her arrival was 101 - 101.9 F. Associated symptoms include chest pain, congestion, coughing and wheezing. Associated symptoms comments: myalgias. Treatments tried: flonase. The treatment provided mild relief.   Patient states that she has no other concerns at this time.   First hit last thur  Then moved fo fever and aches and feeling bad  Today first day not running fever  Asthma has flared up  Using inhjaler four ro five     cgunk Review of Systems  HENT: Positive for congestion.   Respiratory: Positive for cough and wheezing.   Cardiovascular: Positive for chest pain.       Objective:   Physical Exam  Alert moderate malaise HEENT moderate nasal congestion parenchymal lungs bilateral wheezes bronchial cough no tachypnea no crackles heart rare rhythm.      Assessment & Plan:  Impression post flu bronchitis with element of reactive airways plan Ventolin 2 sprays 4 times a day. Symptomatic care discussed. Prednisone taper antibiotics prescribed WSL

## 2015-06-23 ENCOUNTER — Telehealth: Payer: Self-pay | Admitting: Family Medicine

## 2015-06-23 NOTE — Telephone Encounter (Signed)
Pt dropped off a form to be filled out for work.

## 2015-07-06 ENCOUNTER — Ambulatory Visit (INDEPENDENT_AMBULATORY_CARE_PROVIDER_SITE_OTHER): Payer: BLUE CROSS/BLUE SHIELD | Admitting: Family Medicine

## 2015-07-06 ENCOUNTER — Encounter: Payer: Self-pay | Admitting: Family Medicine

## 2015-07-06 VITALS — BP 122/82 | Ht 67.0 in | Wt 179.6 lb

## 2015-07-06 DIAGNOSIS — I10 Essential (primary) hypertension: Secondary | ICD-10-CM | POA: Diagnosis not present

## 2015-07-06 DIAGNOSIS — J4530 Mild persistent asthma, uncomplicated: Secondary | ICD-10-CM | POA: Diagnosis not present

## 2015-07-06 DIAGNOSIS — J683 Other acute and subacute respiratory conditions due to chemicals, gases, fumes and vapors: Secondary | ICD-10-CM

## 2015-07-06 DIAGNOSIS — E785 Hyperlipidemia, unspecified: Secondary | ICD-10-CM

## 2015-07-06 MED ORDER — INDAPAMIDE 2.5 MG PO TABS: 2.5000 mg | ORAL_TABLET | Freq: Every day | ORAL | Status: DC

## 2015-07-06 MED ORDER — ENALAPRIL MALEATE 10 MG PO TABS: 10.0000 mg | ORAL_TABLET | Freq: Every day | ORAL | Status: DC

## 2015-07-06 MED ORDER — PROPANTHELINE BROMIDE 15 MG PO TABS: ORAL_TABLET | ORAL | Status: DC

## 2015-07-06 MED ORDER — FLUTICASONE PROPIONATE HFA 110 MCG/ACT IN AERO: 1.0000 | INHALATION_SPRAY | Freq: Two times a day (BID) | RESPIRATORY_TRACT | Status: DC

## 2015-07-06 MED ORDER — MONTELUKAST SODIUM 10 MG PO TABS: 10.0000 mg | ORAL_TABLET | Freq: Every day | ORAL | Status: DC

## 2015-07-06 MED ORDER — PRAVASTATIN SODIUM 20 MG PO TABS: 20.0000 mg | ORAL_TABLET | Freq: Every day | ORAL | Status: DC

## 2015-07-06 MED ORDER — FEXOFENADINE HCL 180 MG PO TABS: 180.0000 mg | ORAL_TABLET | Freq: Every day | ORAL | Status: DC

## 2015-07-06 NOTE — Progress Notes (Signed)
   Subjective:    Patient ID: Boen Rucks, female    DOB: [birth year], 54 y.o.   MRN: [id number] Patient arrives office with multiple concerns. Hypertension This is a chronic problem. The current episode started more than 1 year ago. Risk factors for coronary artery disease include post-menopausal state and dyslipidemia (ashthma). Treatments tried: enalapril, lozol. There are no compliance problems.     Lipid med taking faithfully, no obv s e's . Overall watching diet. No obvious side effects from medications.  Exercising regularly, four times per wk, walk and jog  Chol med last prescribed by mid level provider   Uses steroid inhaler faithfully  occas has to use the ventolin, sticks with the singulair faithfully. Still has occasional flares of asthma. Has certain signals including exercise and perfumes.  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      Review of Systems No headache no chest pain no back pain no abdominal pain no change in bowel habits    Objective:   Physical Exam  Alert vitals stable. HEENT normal. Lungs clear. Heart regular in rhythm. Ankles without edema.      Assessment & Plan:  Impression 1 hypertension good control #2 asthma overall stable discussed at length #3 hyperlipidemia status uncertain plan appropriate blood work. Diet exercise discussed. Maintain all medications. We will fill out DMV form and fax on work. Recheck every 6 months. Even know patient at times sees her free mid-level provider at work.

## 2015-07-06 NOTE — Patient Instructions (Signed)
Please show to your practitioner at work this  Request: lipid profile, liver profile, and renal function tests are now needed

## 2015-09-02 ENCOUNTER — Telehealth: Payer: Self-pay | Admitting: Family Medicine

## 2015-09-02 NOTE — Telephone Encounter (Signed)
Letter from [facility] is in message in box.

## 2015-09-02 NOTE — Telephone Encounter (Signed)
Pt needs a letter providing cause/reason of pt's vertigo and respiratory problems to determine the severity of any conditions the pt may be experiencing.  Pt also needs a refill on her pravastatin called into [facility] [place].

## 2015-09-02 NOTE — Telephone Encounter (Signed)
Refill praahol times three months as far as letter see what i wrote on it before spkg to pt

## 2015-09-03 MED ORDER — PRAVASTATIN SODIUM 20 MG PO TABS: 20.0000 mg | ORAL_TABLET | Freq: Every day | ORAL | Status: DC

## 2015-09-03 NOTE — Telephone Encounter (Signed)
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 She stated that she believes that they are now requesting the letter from PCP because she listed medications such as antivert, and her inhaler on the list as her medications even though she doesn't take them everyday only as needed. 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

## 2015-09-03 NOTE — Telephone Encounter (Signed)
[facility] 09/03/15 

## 2015-09-16 ENCOUNTER — Encounter: Payer: Self-pay | Admitting: Family Medicine

## 2015-09-16 NOTE — Telephone Encounter (Signed)
Make sure erica got the letter i just dict and get to pt

## 2015-09-24 ENCOUNTER — Encounter: Payer: Self-pay | Admitting: Family Medicine

## 2015-12-23 ENCOUNTER — Telehealth: Payer: Self-pay | Admitting: Family Medicine

## 2015-12-23 DIAGNOSIS — E785 Hyperlipidemia, unspecified: Secondary | ICD-10-CM

## 2015-12-23 DIAGNOSIS — I10 Essential (primary) hypertension: Secondary | ICD-10-CM

## 2015-12-23 NOTE — Telephone Encounter (Signed)
Lip liv m7 will also need o v

## 2015-12-23 NOTE — Telephone Encounter (Signed)
Called patient and informed her per Dr.Steve Luking-Labs ordered and office visit needed. Patient verbalized understanding.

## 2015-12-23 NOTE — Telephone Encounter (Signed)
Pt would like her bw orders put in  Aware of [facility]  No labs in epic

## 2015-12-30 LAB — BASIC METABOLIC PANEL
BUN / CREAT RATIO: 10 (ref 9–23)
BUN: 10 mg/dL (ref 6–24)
CALCIUM: 9.8 mg/dL (ref 8.7–10.2)
CO2: 25 mmol/L (ref 18–29)
CREATININE: 0.98 mg/dL (ref 0.57–1.00)
Chloride: 96 mmol/L (ref 96–106)
GFR, EST AFRICAN AMERICAN: 75 mL/min/{1.73_m2} (ref >59)
GFR, EST NON AFRICAN AMERICAN: 65 mL/min/{1.73_m2} (ref >59)
Glucose: 88 mg/dL (ref 65–99)
Potassium: 4.2 mmol/L (ref 3.5–5.2)
Sodium: 139 mmol/L (ref 134–144)

## 2015-12-30 LAB — LIPID PANEL
Chol/HDL Ratio: 3.9 ratio units (ref 0.0–4.4)
Cholesterol, Total: 220 mg/dL — ABNORMAL HIGH (ref 100–199)
HDL: 56 mg/dL (ref >39)
LDL CALC: 125 mg/dL — AB (ref 0–99)
Triglycerides: 196 mg/dL — ABNORMAL HIGH (ref 0–149)
VLDL CHOLESTEROL CAL: 39 mg/dL (ref 5–40)

## 2015-12-30 LAB — HEPATIC FUNCTION PANEL
ALBUMIN: 4.7 g/dL (ref 3.5–5.5)
ALT: 21 IU/L (ref 0–32)
AST: 18 IU/L (ref 0–40)
Alkaline Phosphatase: 54 IU/L (ref 39–117)
Bilirubin Total: 0.4 mg/dL (ref 0.0–1.2)
Bilirubin, Direct: 0.12 mg/dL (ref 0.00–0.40)
TOTAL PROTEIN: 7.2 g/dL (ref 6.0–8.5)

## 2016-01-06 ENCOUNTER — Ambulatory Visit: Payer: BLUE CROSS/BLUE SHIELD | Admitting: Family Medicine

## 2016-01-08 ENCOUNTER — Ambulatory Visit: Payer: BLUE CROSS/BLUE SHIELD | Admitting: Family Medicine

## 2016-01-12 ENCOUNTER — Ambulatory Visit (INDEPENDENT_AMBULATORY_CARE_PROVIDER_SITE_OTHER): Payer: BLUE CROSS/BLUE SHIELD | Admitting: Family Medicine

## 2016-01-12 ENCOUNTER — Encounter: Payer: Self-pay | Admitting: Family Medicine

## 2016-01-12 VITALS — BP 128/84 | Ht 67.0 in | Wt 180.1 lb

## 2016-01-12 DIAGNOSIS — J4520 Mild intermittent asthma, uncomplicated: Secondary | ICD-10-CM | POA: Diagnosis not present

## 2016-01-12 DIAGNOSIS — E785 Hyperlipidemia, unspecified: Secondary | ICD-10-CM | POA: Diagnosis not present

## 2016-01-12 DIAGNOSIS — J683 Other acute and subacute respiratory conditions due to chemicals, gases, fumes and vapors: Secondary | ICD-10-CM

## 2016-01-12 DIAGNOSIS — I10 Essential (primary) hypertension: Secondary | ICD-10-CM

## 2016-01-12 DIAGNOSIS — J209 Acute bronchitis, unspecified: Secondary | ICD-10-CM

## 2016-01-12 MED ORDER — ENALAPRIL MALEATE 10 MG PO TABS: 10.0000 mg | ORAL_TABLET | Freq: Every day | ORAL | Status: DC

## 2016-01-12 MED ORDER — AZITHROMYCIN 250 MG PO TABS: ORAL_TABLET | ORAL | Status: DC

## 2016-01-12 MED ORDER — PRAVASTATIN SODIUM 20 MG PO TABS: 20.0000 mg | ORAL_TABLET | Freq: Every day | ORAL | Status: DC

## 2016-01-12 MED ORDER — INDAPAMIDE 2.5 MG PO TABS: 2.5000 mg | ORAL_TABLET | Freq: Every day | ORAL | Status: DC

## 2016-01-12 MED ORDER — MONTELUKAST SODIUM 10 MG PO TABS: 10.0000 mg | ORAL_TABLET | Freq: Every day | ORAL | Status: DC

## 2016-01-12 MED ORDER — ALBUTEROL SULFATE HFA 108 (90 BASE) MCG/ACT IN AERS: 2.0000 | INHALATION_SPRAY | RESPIRATORY_TRACT | Status: DC | PRN

## 2016-01-12 MED ORDER — PREDNISONE 10 MG PO TABS: ORAL_TABLET | ORAL | Status: DC

## 2016-01-12 NOTE — Progress Notes (Signed)
   Subjective:    Patient ID: Renee Berger, female    DOB: October 10, 1961, 55 y.o.   MRN: [id number] Claims compliance with blood pressure medicine. Generally does not miss a dose. Medications reviewed today. Watching salt intake. Hypertension This is a chronic problem. The current episode started more than 1 year ago. Associated symptoms include chest pain. There are no compliance problems.   Cough This is a new problem. The current episode started in the past 7 days. The cough is non-productive. Associated symptoms include chest pain.   Results for orders placed or performed in visit on 12/23/15  Lipid panel  Result Value Ref Range   Cholesterol, Total 220 (H) 100 - 199 mg/dL   Triglycerides 478 (H) 0 - 149 mg/dL   HDL 56 >29 mg/dL   VLDL Cholesterol Cal 39 5 - 40 mg/dL   LDL Calculated 562 (H) 0 - 99 mg/dL   Chol/HDL Ratio 3.9 0.0 - 4.4 ratio units  Hepatic function panel  Result Value Ref Range   Total Protein 7.2 6.0 - 8.5 g/dL   Albumin 4.7 3.5 - 5.5 g/dL   Bilirubin Total 0.4 0.0 - 1.2 mg/dL   Bilirubin, Direct 1.30 0.00 - 0.40 mg/dL   Alkaline Phosphatase 54 39 - 117 IU/L   AST 18 0 - 40 IU/L   ALT 21 0 - 32 IU/L  Basic metabolic panel  Result Value Ref Range   Glucose 88 65 - 99 mg/dL   BUN 10 6 - 24 mg/dL   Creatinine, Ser 8.65 0.57 - 1.00 mg/dL   GFR calc non Af Amer 65 >59 mL/min/1.73   GFR calc Af Amer 75 >59 mL/min/1.73   BUN/Creatinine Ratio 10 9 - 23   Sodium 139 134 - 144 mmol/L   Potassium 4.2 3.5 - 5.2 mmol/L   Chloride 96 96 - 106 mmol/L   CO2 25 18 - 29 mmol/L   Calcium 9.8 8.7 - 10.2 mg/dL    Patient has concerns of cough.cough is productive at times. Also feels somewhat tight and wheezy. Patient uses her Flovent but not always twice per day.  Claims compliance with lipid medicine. No obvious side effects. Has cut down fats in diet. Meds reviewed today.  Review of Systems  Respiratory: Positive for cough.   Cardiovascular: Positive for chest pain.        Objective:   Physical Exam  Alert no acute distress. Blood pressure good on repeat HEENT slight nasal congestion pharynx normal lungs bilateral wheezes bronchial cough heart regular rhythm.      Assessment & Plan:  Impression 1 hypertension good control meds reviewed maintain same #2 hyperlipidemia good control meds reviewed maintain same #3 acute bronchitis discussed with exacerbation of asthma. #4 chronic asthma proper use of Flovent reviewed plan prednisone taper. Antibiotics. Chronic meds refilled diet exercise discussed check every 6 months WSL

## 2016-03-17 ENCOUNTER — Ambulatory Visit (INDEPENDENT_AMBULATORY_CARE_PROVIDER_SITE_OTHER): Payer: BLUE CROSS/BLUE SHIELD | Admitting: Allergy and Immunology

## 2016-03-17 ENCOUNTER — Encounter: Payer: Self-pay | Admitting: Allergy and Immunology

## 2016-03-17 VITALS — BP 120/80 | HR 78 | Temp 97.7°F | Resp 16 | Ht 66.54 in | Wt 178.6 lb

## 2016-03-17 DIAGNOSIS — J4540 Moderate persistent asthma, uncomplicated: Secondary | ICD-10-CM

## 2016-03-17 DIAGNOSIS — J309 Allergic rhinitis, unspecified: Secondary | ICD-10-CM | POA: Diagnosis not present

## 2016-03-17 DIAGNOSIS — H1010 Acute atopic conjunctivitis, unspecified eye: Secondary | ICD-10-CM | POA: Diagnosis not present

## 2016-03-17 MED ORDER — BUDESONIDE-FORMOTEROL FUMARATE 160-4.5 MCG/ACT IN AERO: 2.0000 | INHALATION_SPRAY | Freq: Two times a day (BID) | RESPIRATORY_TRACT | Status: DC

## 2016-03-17 NOTE — Progress Notes (Signed)
NEW PATIENT NOTE  RE: Renee Berger MRN: [id number] DOB: 03/08/1961 [facility] [address] [place] [id number] Date of Office Visit: 03/17/2016  Dear Renee AlbertWilliam S Luking, MD:  I had the pleasure of seeing Renee Berger  today in initial evaluation as you recall-- Subjective:  Renee Berger is a 55 y.o. female who presents today for Asthma and Cough  Assessment:   1. Moderate persistent asthma, intermittent symptoms.   2. Allergic rhinoconjunctivitis.    Plan:   Meds ordered this encounter  Medications  . budesonide-formoterol (SYMBICORT) 160-4.5 MCG/ACT inhaler    Sig: Inhale 2 puffs into the lungs 2 (two) times daily. Rinse, gargle and spit out after use.    Dispense:  1 Inhaler    Refill:  3   Patient Instructions  1. Avoidance: Mite, Mold and Pollen 2. Antihistamine: Allegra 180mg  by mouth once daily for runny nose or itching. 3. Nasal Spray: Rhinocort AQ  1-2 spray(s) each nostril once daily for stuffy nose or drainage.  4. Inhalers:  Rescue: Ventolin 2 puffs every 4 hours as needed for cough or wheeze.       -May use 2 puffs 10-20 minutes prior to exercise.  Preventative: Symbicort 160mcg 2 puffs twice daily (Rinse, gargle, and spit out after use)---STOP Flovent 5. Continue Singulair 10mg  each evening. 6. Nasal Saline wash each evening at shower time. 7. Follow up Visit: 1 month or sooner if needed.   Consider re-evaluation of blood pressure medication.  HPI: Renee Berger presents to the office with a diagnosis of Asthma 4 years ago now concerned about recurring cough, using Flovent over the last 2 years.  She reports a recent respiratory infection with associated upper and lower respiratory symptoms, now about 75% improved after completing Prednisone.  She also notes rhinorrhea, sneezing, nasal congestion, itchy watery eyes and occasional sinus pressure. She recalls pollen, dust, perfume/strong odors, outdoor cold temperature and fluctuant  weather patterns as provoking factors to her symptoms.  Symptoms often are daily and exercise induced triggering albuterol use, but now decreased to once daily from four times, as she reports bronchitis 3 times since November. Last chest xray was in 2014.  Denies food sensitivities, GE reflux or recurring sinusitis.  Denies ED or Urgent care visits.  Medical History: Past Medical History  Diagnosis Date  . Hypertension   . NWGNFAOZ(308.6Headache(784.0)    Surgical History: Past Surgical History  Procedure Laterality Date  . Colonoscopy  04/24/2012    Procedure: COLONOSCOPY;  Surgeon: Renee HeadingMark A Jenkins, MD;  Location: [facility];  Service: Gastroenterology;  Laterality: N/A;   Family History: Family History  Problem Relation Age of Onset  . Heart disease Mother 6477    2 stents; died from MI  . Asthma Mother   . Cancer Father 7269    pancreatic  . Cancer Maternal Grandmother 7281  . Heart disease Maternal Grandfather   . Allergic rhinitis Neg Hx   . Angioedema Neg Hx   . Atopy Neg Hx   . Eczema Neg Hx   . Immunodeficiency Neg Hx   . Urticaria Neg Hx    Social History: Social History  . Marital Status: Married    Spouse Name: N/A  . Number of Children: 2  . Years of Education: N/A   Social History Main Topics  . Smoking status: Never Smoker   . Smokeless tobacco: Never Used  . Alcohol Use: Yes     Comment: rare  . Drug Use:  No  . 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  Renee Berger has a current medication list which includes the following prescription(s): albuterol, calcium carb-cholecalciferol, enalapril, estradiol, fexofenadine, fluticasone, indapamide, medroxyprogesterone, montelukast, pravastatin, propantheline, budesonide-formoterol, fish oil-omega-3 fatty acids.  Drug Allergies: No Known Allergies  Environmental History: Renee Berger lives in a 57 year old  house for with wood/carpet floors, with central heat and air; stuffed mattress, non-feather pillow/comforter with bedroom humidifier, indoor dog and smokers.  And outdoor horse, chickens and cat.   Review of Systems  Constitutional: Negative for fever, weight loss and malaise/fatigue.  HENT: Positive for congestion. Negative for ear pain, hearing loss, nosebleeds and sore throat.   Eyes: Negative for discharge and redness.  Respiratory: Negative for shortness of breath.        Denies history of bronchitis and pneumonia.  Gastrointestinal: Negative for heartburn, nausea, vomiting, abdominal pain, diarrhea and constipation.  Genitourinary: Negative.   Musculoskeletal: Negative for myalgias and joint pain.  Skin: Negative.  Negative for itching and rash.  Neurological: Negative.  Negative for dizziness, seizures, weakness and headaches.  Endo/Heme/Allergies: Positive for environmental allergies.       Denies sensitivity to NSAIDs, stinging insects, foods, latex, jewelry and cosmetics; avoids aspirin due to stomach upset.  Immunological: No chronic or recurring infections. Objective:   Filed Vitals:   03/17/16 1524  BP: 120/80  Pulse: 78  Temp: 97.7 F (36.5 C)  Resp: 16   SpO2 Readings from Last 1 Encounters:  03/17/16 98%   Physical Exam  Constitutional: She is well-developed, well-nourished, and in no distress.  HENT:  Head: Atraumatic.  Right Ear: Tympanic membrane and ear canal normal.  Left Ear: Tympanic membrane and ear canal normal.  Nose: Mucosal edema present. No rhinorrhea. No epistaxis.  Mouth/Throat: Oropharynx is clear and moist and mucous membranes are normal. No oropharyngeal exudate, posterior oropharyngeal edema or posterior oropharyngeal erythema.  Eyes: Conjunctivae are normal.  Neck: Neck supple.  Cardiovascular: Normal rate, S1 normal and S2 normal.   No murmur heard. Pulmonary/Chest: Effort normal. She has no wheezes. She has no rhonchi. She has no rales.    Abdominal: Soft. Normal appearance and bowel sounds are normal.  Musculoskeletal: She exhibits no edema.  Lymphadenopathy:    She has no cervical adenopathy.  Neurological: She is alert.  Skin: Skin is warm and intact. No rash noted. No cyanosis. Nails show no clubbing.   Diagnostics: Spirometry:  FVC 3.10--94%, FEV1 2.73--103%; postbronchodilator improvement FVC  3.44--105, FEV1 3.01--114%.  Skin testing:  Strong reactivity to dog epithelial, mild reactivity to mold species and cockroach.    Roselyn M. Willa Rough, MD   cc: Lubertha South, MD

## 2016-03-17 NOTE — Patient Instructions (Signed)
Take Home Sheet  1. Avoidance: Mite, Mold and Pollen   2. Antihistamine: Allegra 180mg  by mouth once daily for runny nose or itching.   3. Nasal Spray: Rhinocort AQ  1-2 spray(s) each nostril once daily for stuffy nose or drainage.    4. Inhalers:  Rescue: Ventolin 2 puffs every 4 hours as needed for cough or wheeze.       -May use 2 puffs 10-20 minutes prior to exercise.   Preventative: Symbicort 160mcg 2 puffs twice daily (Rinse, gargle, and spit out after use)---STOP Flovent   5. Continue Singulair 10mg  each evening.   6. Nasal Saline wash each evening at shower time.   7. Follow up Visit: 1 month or sooner if needed.   Consider re-evaluation of blood pressure medication.  Websites that have reliable Patient information: 1. American Academy of Asthma, Allergy, & Immunology: www.aaaai.org 2. Food Allergy Network: www.foodallergy.org 3. Mothers of Asthmatics: www.aanma.org 4. [facility]: https://www.strong.com/www.njc.org 5. American College of Allergy, Asthma, & Immunology: BiggerRewards.iswww.allergy.mcg.edu or www.acaai.org  Reducing Pollen Exposure  The American Academy of Allergy, Asthma and Immunology suggests the following steps to reduce your exposure to pollen during allergy seasons.  1. Do not hang sheets or clothing out to dry; pollen may collect on these items. 2. Do not mow lawns or spend time around freshly cut grass; mowing stirs up pollen. 3. Keep windows closed at night.  Keep car windows closed while driving. 4. Minimize morning activities outdoors, a time when pollen counts are usually at their highest. 5. Stay indoors as much as possible when pollen counts or humidity is high and on windy days when pollen tends to remain in the air longer. 6. Use air conditioning when possible.  Many air conditioners have filters that trap the pollen spores. 7. Use a HEPA room air filter to remove pollen form the indoor air you breathe.  Control of Mold Allergen  Mold  and fungi can grow on a variety of surfaces provided certain temperature and moisture conditions exist.  Outdoor molds grow on plants, decaying vegetation and soil.  The major outdoor mold, Alternaria dn Cladosporium, are found in very high numbers during hot and dry conditions.  Generally, a late Summer - Fall peak is seen for common outdoor fungal spores.  Rain will temporarily lower outdoor mold spore count, but counts rise rapidly when the rainy period ends.  The most important indoor molds are Aspergillus and Penicillium.  Dark, humid and poorly ventilated basements are ideal sites for mold growth.  The next most common sites of mold growth are the bathroom and the kitchen.  Outdoor MicrosoftMold Control 1. Use air conditioning and keep windows closed 2. Avoid exposure to decaying vegetation. 3. Avoid leaf raking. 4. Avoid grain handling. 5. Consider wearing a face mask if working in moldy areas.  Indoor Mold Control 1. Maintain humidity below 50%. 2. Clean washable surfaces with 5% bleach solution. 3. Remove sources e.g. Contaminated carpets.  Control of Cockroach Allergen  Cockroach allergen has been identified as an important cause of acute attacks of asthma, especially in urban settings.  There are fifty-five species of cockroach that exist in the [place], however only three, the [place], [place] and Oriental species produce allergen that can affect patients with Asthma.  Allergens can be obtained from fecal particles, egg casings and secretions from cockroaches.  1. Remove food sources. 2. Reduce access to water. 3. Seal access and entry points. 4. Spray runways with 0.5-1% Diazinon  or Chlorpyrifos 5. Blow boric acid power under stoves and refrigerator. 6. Place bait stations (hydramethylnon) at feeding sites.   Control of House Dust Mite Allergen  House dust mites play a major role in allergic asthma and rhinitis.  They occur in environments with high humidity wherever human skin,  the food for dust mites is found. High levels have been detected in dust obtained from mattresses, pillows, carpets, upholstered furniture, bed covers, clothes and soft toys.  The principal allergen of the house dust mite is found in its feces.  A gram of dust may contain 1,000 mites and 250,000 fecal particles.  Mite antigen is easily measured in the air during house cleaning activities.  8. Encase mattresses, including the box spring, and pillow, in an air tight cover.  Seal the zipper end of the encased mattresses with wide adhesive tape. 9. Wash the bedding in water of 130 degrees Farenheit weekly.  Avoid cotton comforters/quilts and flannel bedding: the most ideal bed covering is the dacron comforter. 10. Remove all upholstered furniture from the bedroom. 11. Remove carpets, carpet padding, rugs, and non-washable window drapes from the bedroom.  Wash drapes weekly or use plastic window coverings. 12. Remove all non-washable stuffed toys from the bedroom.  Wash stuffed toys weekly. 13. Have the room cleaned frequently with a vacuum cleaner and a damp dust-mop.  The patient should not be in a room which is being cleaned and should wait 1 hour after cleaning before going into the room. 14. Close and seal all heating outlets in the bedroom.  Otherwise, the room will become filled with dust-laden air.  An electric heater can be used to heat the room. 15. Reduce indoor humidity to less than 50%.  Do not use a humidifier.

## 2016-05-10 ENCOUNTER — Encounter: Payer: Self-pay | Admitting: Allergy and Immunology

## 2016-05-10 ENCOUNTER — Ambulatory Visit (INDEPENDENT_AMBULATORY_CARE_PROVIDER_SITE_OTHER): Payer: BLUE CROSS/BLUE SHIELD | Admitting: Allergy and Immunology

## 2016-05-10 VITALS — BP 110/60 | HR 69 | Temp 98.4°F | Resp 14

## 2016-05-10 DIAGNOSIS — J309 Allergic rhinitis, unspecified: Secondary | ICD-10-CM | POA: Diagnosis not present

## 2016-05-10 DIAGNOSIS — J4540 Moderate persistent asthma, uncomplicated: Secondary | ICD-10-CM | POA: Diagnosis not present

## 2016-05-10 DIAGNOSIS — H1010 Acute atopic conjunctivitis, unspecified eye: Secondary | ICD-10-CM | POA: Diagnosis not present

## 2016-05-10 MED ORDER — LEVOCETIRIZINE DIHYDROCHLORIDE 5 MG PO TABS: 5.0000 mg | ORAL_TABLET | Freq: Every evening | ORAL | Status: DC

## 2016-05-10 NOTE — Patient Instructions (Signed)
   Continue current medication regime.  Give trial of Xyzal 5 mg once daily as needed in place of Allegra.  If any acute or recurring symptoms increase Symbicort to 2 puffs twice daily with albuterol and call for acute appointment.  Follow-up in 4 months or sooner needed.

## 2016-05-10 NOTE — Progress Notes (Addendum)
     FOLLOW UP NOTE  RE: Renee Berger MRN: [id number] DOB: March 25, 1961 [facility] [address] [place], [place] [id number] Date of Office Visit: 05/10/2016  Subjective:  Renee Berger is a 55 y.o. female who presents today for Asthma  Assessment:   1. Moderate persistent asthma, Improved control.   2. Allergic rhinoconjunctivitis.   3.      Complex medical history. Plan:   Meds ordered this encounter  Medications  . levocetirizine (XYZAL) 5 MG tablet    Sig: Take 1 tablet (5 mg total) by mouth every evening.    Dispense:  30 tablet    Refill:  5   Patient Instructions  1.  Continue current medication regime. 2.  Give trial of Xyzal 5 mg once daily as needed in place of Allegra. 3.  If any acute or recurring symptoms increase Symbicort to 2 puffs twice daily with albuterol and call for acute appointment. 4.  Follow-up in 4 months or sooner needed.  HPI: Renee Berger returns the office in follow-up of allergic rhinoconjunctivitis and asthma.  Since her initial visit in April she is very pleased with the increased medication regime and is tolerating activity and fluctuant, indoor, outdoor environment without difficulty.  She has maintained on Singulair and Rhinocort as well, though she thought Allegra was almost too drying giving her a sensation of dizziness.  She is not required any albuterol since her visit and is very pleased with her regime. She does indicate instituting environmental  control measures and they were able to obtain a new heat pump, with air filtration as well.  Denies ED or urgent care visits, prednisone or antibiotic courses. Reports sleep and activity are normal.  Renee Berger has a current medication list which includes the following prescription(s): albuterol, budesonide-formoterol, calcium carb-cholecalciferol, enalapril, estradiol, fish oil-omega-3 fatty acids, indapamide, medroxyprogesterone, montelukast, pravastatin.   Drug Allergies: No  Known Allergies  Objective:   Filed Vitals:   05/10/16 1550  BP: 110/60  Pulse: 69  Temp: 98.4 F (36.9 C)  Resp: 14   SpO2 Readings from Last 1 Encounters:  05/10/16 98%   Physical Exam  Constitutional: She is well-developed, well-nourished, and in no distress.  HENT:  Head: Atraumatic.  Right Ear: Tympanic membrane and ear canal normal.  Left Ear: Tympanic membrane and ear canal normal.  Nose: Mucosal edema present. No rhinorrhea. No epistaxis.  Mouth/Throat: Oropharynx is clear and moist and mucous membranes are normal. No oropharyngeal exudate, posterior oropharyngeal edema or posterior oropharyngeal erythema.  Neck: Neck supple.  Cardiovascular: Normal rate, S1 normal and S2 normal.   No murmur heard. Pulmonary/Chest: Effort normal. She has no wheezes. She has no rhonchi. She has no rales.  Lymphadenopathy:    She has no cervical adenopathy.   Diagnostics: Spirometry:  FVC  3.23--104%, FEV1 2.75--104%.    Renee Berger M. Willa RoughHicks, MD  cc: Lubertha SouthSteve Luking, MD

## 2016-07-12 ENCOUNTER — Encounter: Payer: Self-pay | Admitting: Family Medicine

## 2016-07-12 ENCOUNTER — Ambulatory Visit (INDEPENDENT_AMBULATORY_CARE_PROVIDER_SITE_OTHER): Payer: BLUE CROSS/BLUE SHIELD | Admitting: Family Medicine

## 2016-07-12 VITALS — BP 128/76 | Ht 67.0 in | Wt 178.0 lb

## 2016-07-12 DIAGNOSIS — J683 Other acute and subacute respiratory conditions due to chemicals, gases, fumes and vapors: Secondary | ICD-10-CM

## 2016-07-12 DIAGNOSIS — E785 Hyperlipidemia, unspecified: Secondary | ICD-10-CM | POA: Diagnosis not present

## 2016-07-12 DIAGNOSIS — J4530 Mild persistent asthma, uncomplicated: Secondary | ICD-10-CM | POA: Diagnosis not present

## 2016-07-12 DIAGNOSIS — I10 Essential (primary) hypertension: Secondary | ICD-10-CM

## 2016-07-12 DIAGNOSIS — Z79899 Other long term (current) drug therapy: Secondary | ICD-10-CM | POA: Diagnosis not present

## 2016-07-12 MED ORDER — PRAVASTATIN SODIUM 20 MG PO TABS: 20.0000 mg | ORAL_TABLET | Freq: Every day | ORAL | 1 refills | Status: DC

## 2016-07-12 MED ORDER — INDAPAMIDE 2.5 MG PO TABS: 2.5000 mg | ORAL_TABLET | Freq: Every day | ORAL | 1 refills | Status: DC

## 2016-07-12 MED ORDER — PROPANTHELINE BROMIDE 15 MG PO TABS: ORAL_TABLET | ORAL | 1 refills | Status: DC

## 2016-07-12 MED ORDER — MONTELUKAST SODIUM 10 MG PO TABS: 10.0000 mg | ORAL_TABLET | Freq: Every day | ORAL | 1 refills | Status: DC

## 2016-07-12 MED ORDER — ENALAPRIL MALEATE 10 MG PO TABS: 10.0000 mg | ORAL_TABLET | Freq: Every day | ORAL | 1 refills | Status: DC

## 2016-07-12 NOTE — Progress Notes (Signed)
   Subjective:    Patient ID: Renee Berger, female    DOB: 05/07/1961, 55 y.o.   MRN: [id number]  Hyperlipidemia  This is a chronic problem. Treatments tried: pravastatin. There are no compliance problems.   pt states Dr. Esperanza Richters left and she wants dr Brett Canales to take over the asthma meds. Xyzal, symbicort, ventolin, and singulair.  Has dogd, but has a filter in the heat pump and it has helped Overall last under decent control on current medications   Patient continues to take lipid medication regularly. No obvious side effects from it. Generally does not miss a dose. Prior blood work results are reviewed with patient. Patient continues to work on fat intake in diet  Exercising three tinmes per wk   Blood pressure medicine and blood pressure levels reviewed today with patient. Compliant with blood pressure medicine. States does not miss a dose. No obvious side effects. Blood pressure generally good when checked elsewhere. Watching salt intake.     Pt needs refills on all meds.     Review of Systems No headache, no major weight loss or weight gain, no chest pain no back pain abdominal pain no change in bowel habits complete ROS otherwise negative     Objective:   Physical Exam  Alert no acute distress vital stable HEENT normal lungs clear heart regular in rhythm. Blood pressure improved on repeat 136/78      Assessment & Plan:  Impression 1 hypertension good control discussed maintain same meds #2 hyperlipidemia good control discussed maintain same meds #3 asthma now generally good control tarry meds well discussed maintain same plan diet exercise discussed appropriate blood work 6 months worth medicine WSL

## 2016-07-18 ENCOUNTER — Telehealth: Payer: Self-pay | Admitting: Allergy and Immunology

## 2016-07-18 NOTE — Telephone Encounter (Signed)
Please call patient back after 1pm on her cell phone. 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 She needs an itemized statement from her initiall visit with Dr. Willa RoughHicks.

## 2016-08-18 ENCOUNTER — Other Ambulatory Visit: Payer: Self-pay | Admitting: Allergy and Immunology

## 2016-08-18 MED ORDER — BUDESONIDE-FORMOTEROL FUMARATE 160-4.5 MCG/ACT IN AERO: 2.0000 | INHALATION_SPRAY | Freq: Two times a day (BID) | RESPIRATORY_TRACT | 1 refills | Status: DC

## 2016-08-18 NOTE — Telephone Encounter (Signed)
Renee Berger requests to be called after 12:00, not before.

## 2016-08-18 NOTE — Telephone Encounter (Signed)
Patient called requesting a refill or a new, written prescription, for Symbicort. She says she has remaining refills but went online and found a coupon and the coupon is telling her she has to have a new, written prescription. Last saw Dr. Willa RoughHicks on 5/50/17 and has an appt with Dr. Dellis AnesGallagher on 09/13/16. Pharmacy is [facility] in [place].

## 2016-08-18 NOTE — Telephone Encounter (Signed)
Spoke with patient advised we sent in Rx under Dr Dellis AnesGallagher to see if she can use coupon.

## 2016-09-13 ENCOUNTER — Ambulatory Visit (INDEPENDENT_AMBULATORY_CARE_PROVIDER_SITE_OTHER): Payer: BLUE CROSS/BLUE SHIELD | Admitting: Allergy & Immunology

## 2016-09-13 ENCOUNTER — Encounter: Payer: Self-pay | Admitting: Allergy & Immunology

## 2016-09-13 VITALS — BP 130/75 | HR 71 | Temp 98.1°F | Resp 16

## 2016-09-13 DIAGNOSIS — J4540 Moderate persistent asthma, uncomplicated: Secondary | ICD-10-CM

## 2016-09-13 DIAGNOSIS — J309 Allergic rhinitis, unspecified: Secondary | ICD-10-CM

## 2016-09-13 DIAGNOSIS — H1010 Acute atopic conjunctivitis, unspecified eye: Secondary | ICD-10-CM

## 2016-09-13 MED ORDER — AZELASTINE-FLUTICASONE 137-50 MCG/ACT NA SUSP: 2.0000 | NASAL | 5 refills | Status: DC

## 2016-09-13 NOTE — Progress Notes (Signed)
FOLLOW UP  Date of Service/Encounter:  09/13/16   Assessment:   Moderate persistent asthma, uncomplicated  Allergic rhinoconjunctivitis   Asthma Reportables:  Severity: moderate persistent  Risk: low Control: well controlled  Seasonal Influenza Vaccine: no but encouraged     Plan/Recommendations:   1. Moderate persistent asthma, uncomplicated - Continue Symbicort 160/4.5 two puffs twice daily. - Emphasized spacer use every time.  - Continue with albuterol 4 puffs every 4-6 hours as needed for coughing/wheezing/shortness of breath.  2. Allergic rhinoconjunctivitis - Continue with Singulair 10mg  daily and Xyzal 5mg  daily. - Stop the Rhinocort and start Dymista one spray per nostril twice daily. - Discussed immunotherapy briefly, however she is not ready to commit to that at this time.   3. Return in about 6 months (around 03/14/2017).   Subjective:   Renee Berger is a 55 y.o. female presenting today for follow up of  Chief Complaint  Patient presents with  . Asthma  .  Selleck RucksKelly R Davies has a history of the following: Patient Active Problem List   Diagnosis Date Noted  . Hyperlipidemia LDL goal <130 01/06/2015  . 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 Essential hypertension, benign 06/23/2013  . Allergic rhinitis 06/23/2013    History obtained from: chart review and patient.  Palau RucksKelly R Gratz was referred by Lubertha SouthSteve Luking, MD.     Renee Berger is a 55 y.o. female presenting for a follow up visit for asthma and allergic rhinits. Renee Berger was last seen in May 2017 by Dr. Willa RoughHicks, who has since left the practice. At that time, she was doing well on Singulair and Rhinocort for her allergies. She was changed from Allegra to Xyzal at the last visit. Her asthma was under good control with Symbicort 160/4.5 two puffs once daily, increasing to two puffs twice daily with worsening respiratory problems.   Since the last visit, she has done well. Lately, she is having  worsening problems with the changing of the seasons. She has grandchildren who passed a viral infection to her. She feels that everything "goes into the chest". She has used her albuterol two puffs a couple of times for rescue but she feels that she is getting better. She is not interested in prednisone or antibiotics today. She has not had bronchitis in quite sometime. She feels much better since [facility].   She is currently using Rhinocort one spray twice daily. This is helping somewhat but she continues to remain congested especially at night. She is allergic to dogs and they have eight dogs (four dachshunds, a cockapoo, and a bomb-sniffing canine inside as well as a drug sniffing canine and a bloodhound outside).   Otherwise, there have been no changes to the past medical history, surgical history, family history, or social history.     Review of Systems: a 14-point review of systems is pertinent for what is mentioned in HPI.  Otherwise, all other systems were negative. Constitutional: negative other than that listed in the HPI Eyes: negative other than that listed in the HPI Ears, nose, mouth, throat, and face: negative other than that listed in the HPI Respiratory: negative other than that listed in the HPI Cardiovascular: negative other than that listed in the HPI Gastrointestinal: negative other than that listed in the HPI Genitourinary: negative other than that listed in the HPI Integument: negative other than that listed in the HPI Hematologic: negative other than that listed in the HPI Musculoskeletal: negative other than  that listed in the HPI Neurological: negative other than that listed in the HPI Allergy/Immunologic: negative other than that listed in the HPI    Objective:   Blood pressure 130/75, pulse 71, temperature 98.1 F (36.7 C), temperature source Oral, resp. rate 16, SpO2 98 %. There is no height or weight on file to  calculate BMI.   Physical Exam:  General: Alert, interactive, in no acute distress. Nasal sounding voice. Very pleasant and talkative. HEENT: TMs pearly gray, turbinates edematous with clear discharge, post-pharynx erythematous. Cobblestoning in the posterior oropharynx.  Neck: Supple without thyromegaly. Lungs: Clear to auscultation without wheezing, rhonchi or rales. No increased work of breathing. CV: Normal S1, S2 without murmurs. Capillary refill <2 seconds.  Abdomen: Nondistended, nontender. Skin: Warm and dry, without lesions or rashes. Extremities:  No clubbing, cyanosis or edema. Neuro:   Grossly intact.   Diagnostic studies:  Spirometry: results normal (FEV1: 2.98/113%, FVC: 3.35/108%, FEV1/FVC: 88%).    Spirometry consistent with normal pattern   Allergy Studies: None   Malachi Bonds, MD [facility] [place]

## 2016-09-13 NOTE — Patient Instructions (Addendum)
1. Moderate persistent asthma, uncomplicated - Continue Symbicort 160/4.5 two puffs twice daily. - Continue with albuterol 4 puffs every 4-6 hours as needed for coughing/wheezing/shortness of breath.  2. Allergic rhinoconjunctivitis - Continue with Singulair 10mg  daily and Xyzal 5mg  daily. - Stop the Rhinocort and start Dymista one spray per nostril twice daily.  3. Return in about 6 months (around 03/14/2017).  Please inform us of any Emergency Department visits, hospitalizations, or changes in symptoms. Call us before going to the ED for breathing or allergy symptoms since we might be able to fit you in for a sick visit. Feel free to contact us anytime with any questions, problems, or concerns.  It was a pleasure to meet you today!   Websites that have reliable patient information: 1. American Academy of Asthma, Allergy, and Immunology: www.aaaai.org 2. Food Allergy Research and Education (FARE): foodallergy.org 3. Mothers of Asthmatics: http://www.asthmacommunitynetwork.org 4. American College of Allergy, Asthma, and Immunology: www.acaai.org

## 2016-10-01 ENCOUNTER — Other Ambulatory Visit: Payer: Self-pay | Admitting: Family Medicine

## 2016-10-04 ENCOUNTER — Other Ambulatory Visit: Payer: Self-pay | Admitting: Family Medicine

## 2016-10-31 ENCOUNTER — Telehealth: Payer: Self-pay | Admitting: Family Medicine

## 2016-10-31 NOTE — Telephone Encounter (Signed)
Pt dropped off [facility]mv forms to be filled out. Forms are at nurse station.

## 2017-01-13 ENCOUNTER — Other Ambulatory Visit: Payer: Self-pay | Admitting: Family Medicine

## 2017-01-13 ENCOUNTER — Ambulatory Visit: Payer: BLUE CROSS/BLUE SHIELD | Admitting: Family Medicine

## 2017-01-18 ENCOUNTER — Telehealth: Payer: Self-pay | Admitting: Family Medicine

## 2017-01-18 NOTE — Telephone Encounter (Signed)
Patient called about her appointment next week and wanted to make sure that her orders were in the system to get her lab work done.  I spoke with Carollee HerterShannon and she said the orders we have in the system dated 07-12-16 are still active and patient can go to [facility] anytime this week to have her labwork done.  I called the patient back and left a message on her cell phone saying ok to go to get labs and reminded her of her appt time on the 01-27-17.

## 2017-01-23 ENCOUNTER — Other Ambulatory Visit: Payer: Self-pay | Admitting: Family Medicine

## 2017-01-24 ENCOUNTER — Other Ambulatory Visit: Payer: Self-pay | Admitting: *Deleted

## 2017-01-24 DIAGNOSIS — E785 Hyperlipidemia, unspecified: Secondary | ICD-10-CM

## 2017-01-24 DIAGNOSIS — Z79899 Other long term (current) drug therapy: Secondary | ICD-10-CM

## 2017-01-25 LAB — LIPID PANEL
CHOL/HDL RATIO: 4.4 ratio (ref 0.0–4.4)
Cholesterol, Total: 201 mg/dL — ABNORMAL HIGH (ref 100–199)
HDL: 46 mg/dL (ref >39)
LDL Calculated: 99 mg/dL (ref 0–99)
TRIGLYCERIDES: 280 mg/dL — AB (ref 0–149)
VLDL Cholesterol Cal: 56 mg/dL — ABNORMAL HIGH (ref 5–40)

## 2017-01-25 LAB — HEPATIC FUNCTION PANEL
ALBUMIN: 4.3 g/dL (ref 3.5–5.5)
ALK PHOS: 49 IU/L (ref 39–117)
ALT: 28 IU/L (ref 0–32)
AST: 25 IU/L (ref 0–40)
Bilirubin Total: <0.2 mg/dL (ref 0.0–1.2)
Bilirubin, Direct: 0.07 mg/dL (ref 0.00–0.40)
Total Protein: 6.6 g/dL (ref 6.0–8.5)

## 2017-01-27 ENCOUNTER — Encounter: Payer: Self-pay | Admitting: Family Medicine

## 2017-01-27 ENCOUNTER — Ambulatory Visit (INDEPENDENT_AMBULATORY_CARE_PROVIDER_SITE_OTHER): Payer: BLUE CROSS/BLUE SHIELD | Admitting: Family Medicine

## 2017-01-27 VITALS — BP 124/76 | Ht 67.0 in | Wt 180.0 lb

## 2017-01-27 DIAGNOSIS — I10 Essential (primary) hypertension: Secondary | ICD-10-CM

## 2017-01-27 DIAGNOSIS — Z23 Encounter for immunization: Secondary | ICD-10-CM

## 2017-01-27 DIAGNOSIS — E7800 Pure hypercholesterolemia, unspecified: Secondary | ICD-10-CM | POA: Diagnosis not present

## 2017-01-27 DIAGNOSIS — S0300XA Dislocation of jaw, unspecified side, initial encounter: Secondary | ICD-10-CM

## 2017-01-27 DIAGNOSIS — J4530 Mild persistent asthma, uncomplicated: Secondary | ICD-10-CM

## 2017-01-27 DIAGNOSIS — J683 Other acute and subacute respiratory conditions due to chemicals, gases, fumes and vapors: Secondary | ICD-10-CM

## 2017-01-27 DIAGNOSIS — N3010 Interstitial cystitis (chronic) without hematuria: Secondary | ICD-10-CM

## 2017-01-27 DIAGNOSIS — M26602 Left temporomandibular joint disorder, unspecified: Secondary | ICD-10-CM

## 2017-01-27 MED ORDER — MONTELUKAST SODIUM 10 MG PO TABS: 10.0000 mg | ORAL_TABLET | Freq: Every day | ORAL | 1 refills | Status: DC

## 2017-01-27 MED ORDER — ENALAPRIL MALEATE 10 MG PO TABS: 10.0000 mg | ORAL_TABLET | Freq: Every day | ORAL | 1 refills | Status: DC

## 2017-01-27 MED ORDER — PRAVASTATIN SODIUM 20 MG PO TABS: 20.0000 mg | ORAL_TABLET | Freq: Every day | ORAL | 1 refills | Status: DC

## 2017-01-27 MED ORDER — INDAPAMIDE 2.5 MG PO TABS: 2.5000 mg | ORAL_TABLET | Freq: Every day | ORAL | 1 refills | Status: DC

## 2017-01-27 NOTE — Patient Instructions (Signed)

## 2017-01-27 NOTE — Progress Notes (Signed)
   Subjective:    Patient ID: Renee Berger, female    DOB: Nov 29, 1961, 56 y.o.   MRN: [id number]  Hypertension  This is a chronic problem. The current episode started more than 1 year ago. There are no compliance problems (eats healthy, exercises, takes meds every day).    Results for orders placed or performed in visit on 01/24/17  Hepatic function panel  Result Value Ref Range   Total Protein 6.6 6.0 - 8.5 g/dL   Albumin 4.3 3.5 - 5.5 g/dL   Bilirubin Total <8.6<0.2 0.0 - 1.2 mg/dL   Bilirubin, Direct 5.780.07 0.00 - 0.40 mg/dL   Alkaline Phosphatase 49 39 - 117 IU/L   AST 25 0 - 40 IU/L   ALT 28 0 - 32 IU/L  Lipid panel  Result Value Ref Range   Cholesterol, Total 201 (H) 100 - 199 mg/dL   Triglycerides 469280 (H) 0 - 149 mg/dL   HDL 46 >62>39 mg/dL   VLDL Cholesterol Cal 56 (H) 5 - 40 mg/dL   LDL Calculated 99 0 - 99 mg/dL   Chol/HDL Ratio 4.4 0.0 - 4.4 ratio units   tmj left side,positive pain with chewing.Difficulty opening mouth completely  Two weeks duration, ibuprofen helped  Not a gum chewer, into almonds lately   Patient continues to take lipid medication regularly. No obvious side effects from it. Generally does not miss a dose. Prior blood work results are reviewed with patient. Patient continues to work on fat intake in diet  Blood pressure medicine and blood pressure levels reviewed today with patient. Compliant with blood pressure medicine. States does not miss a dose. No obvious side effects. Blood pressure generally good when checked elsewhere. Watching salt intake.  Patient reports asthma stable on current medications. Patient tolerating it well. Would like us to start taking over her management her chronic asthma medication.  Ongoing challenges with interstitial cystitis. Patient has used Pro-Banthine in the past. We took over prescription as a courtesy. Patient now states that this medication is not working htn .   execise overall bttter and mimproved   Stomach  bug lately   Review of Systems No headache, no major weight loss or weight gain, no chest pain no back pain abdominal pain no change in bowel habits complete ROS otherwise negative     Objective:   Physical Exam Alert vitals stable, NAD. Blood pressure good on repeat. HEENT normal. Lungs clear. Heart regular rate and rhythm. Left TMJ no obvious swelling positive pain with extension positive pain to deep palpation       Assessment & Plan:  Impression 1 hypertension good control discussed maintain same meds rationale discusse #2 chronic asthma stable on current intervention. Compliant with. States deftly helps would like us to take over #3 left TMJ discussed. We could do a full workup patient not inclined at this time. Anti-inflammatory medicine discuss and recommended #3 hyperlipidemia numbers reviewed to maintain same compliance importance discussed #5 interstitial cystitis. Long discussion this regard also. Pro-Banthine no longer is very well cover. I've advised patient she really needs to back to her specialist in the scarred plan 40 minutes spent most in discussion of patient's tremendous number of issues and proper management greater than 50% time spent in counseling and discussion. Medications refilled. Also flu shot. Blood work reviewedd

## 2017-01-31 ENCOUNTER — Other Ambulatory Visit: Payer: Self-pay | Admitting: *Deleted

## 2017-01-31 MED ORDER — LEVOCETIRIZINE DIHYDROCHLORIDE 5 MG PO TABS: 5.0000 mg | ORAL_TABLET | Freq: Every evening | ORAL | 2 refills | Status: DC

## 2017-03-14 ENCOUNTER — Ambulatory Visit (INDEPENDENT_AMBULATORY_CARE_PROVIDER_SITE_OTHER): Payer: BLUE CROSS/BLUE SHIELD | Admitting: Allergy & Immunology

## 2017-03-14 ENCOUNTER — Encounter: Payer: Self-pay | Admitting: Allergy & Immunology

## 2017-03-14 VITALS — BP 142/94 | HR 72 | Resp 16

## 2017-03-14 DIAGNOSIS — J3081 Allergic rhinitis due to animal (cat) (dog) hair and dander: Secondary | ICD-10-CM

## 2017-03-14 DIAGNOSIS — K219 Gastro-esophageal reflux disease without esophagitis: Secondary | ICD-10-CM

## 2017-03-14 DIAGNOSIS — J4540 Moderate persistent asthma, uncomplicated: Secondary | ICD-10-CM | POA: Diagnosis not present

## 2017-03-14 NOTE — Progress Notes (Signed)
FOLLOW UP  Date of Service/Encounter:  03/14/17   Assessment:   Moderate persistent asthma, uncomplicated  Chronic allergic rhinitis  Gastroesophageal reflux disease   Asthma Reportables:  Severity: moderate persistent  Risk: low Control: well controlled  Seasonal Influenza Vaccine: yes    Plan/Recommendations:   1. Moderate persistent asthma - with worsening symptoms due to allergies versus GERD - Lung testing today looked perfect. - We will not make any medication changes at this time. - I feel that Renee Berger has a baseline allergic status with a lower threshold due to the chronic exposure to dog dander, and therefore the exposure to outdoor pollens put her over the edge on Easter Sunday.  - The presence of reflux could also be contributing to her current symptoms.  - Daily controller medication(s): Symbicort 160/4.5 two puffs twice daily with spacer + Singulair  daily - Rescue medications: ProAir 4 puffs every 4-6 hours as needed - Asthma control goals:  * Full participation in all desired activities (may need albuterol before activity) * Albuterol use two time or less a week on average (not counting use with activity) * Cough interfering with sleep two time or less a month * Oral steroids no more than once a year * No hospitalizations  2. GERD - This might be worsening your asthma as well.  - Samples of Dexilant  provided.   3. Chronic allergic rhinitis  - Continue with Xyzal  daily. - Continue with Dymista one spray per nostril twice daily. - Consider allergy shots in the future for more long-term control.  4. Return in about 6 months (around 09/13/2017).   Subjective:   Renee Berger is a 56 y.o. female presenting today for follow up of  Chief Complaint  Patient presents with  . Chest Pain    tightness last 3 days  . Cough    Borgerding Berger has a history of the following: Patient Active Problem List   Diagnosis Date Noted  .  Hyperlipidemia LDL goal <130 01/06/2015  . 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 Essential hypertension, benign 06/23/2013  . Allergic rhinitis 06/23/2013    History obtained from: chart review and patient.  Renee Berger was referred by Lubertha South, MD.     Renee Berger is a 56 y.o. female presenting for a follow up visit. She was last seen in October 2017. At that time, we continued her on Symbicort 160/4.5 2 inhalations twice daily. I emphasized the use of a spacer. For her allergic rhinoconjunctivitis, I continued her on Singulair 10 mg daily as well as signs 5 mg daily. We changed her from Rhinocort to Dymista one spray per nostril twice daily.  Since last visit, she has mostly done well. However, she has been sick for the last few days with nasal congestion, coughing, and chest tightness. Symptoms started on Easter Sunday or so when she was outside for a prolonged period of time during an [place] egg hunt with her 7 grandchildren. She has been using her rescue inhaler regularly (three times daily) for around four days. She was taking Symbicort two puffs once daily but she has increased it to two puffs twice daily. Yesterday was bad but today has been slightly better. She was not under the impression that she needed the Symbicort twice daily. Of note, she does have some heartburn that has worsened over the past few nights. At one time. She was on Prevacid at one point but she has not used it in  quite some time. She does endorse both chest tightness and traditional heart burn pain over the last couple of days that has been occurring in conjunction with her other symptoms  Allergic rhinitis is fairly well controlled. She is having increased ocular symptoms, which are particularly bad at work ([employer]). This is a new building. She has never been on an eye drop aside from over the counter drops. She continues on the Dymista but is unsure if it provides much help. She only uses it as  needed. As noted in previous visits, she does have multiple dogs at home, which are a known trigger for her allergy symptoms.  Otherwise, there have been no changes to her past medical history, surgical history, family history, or social history.    Review of Systems: a 14-point review of systems is pertinent for what is mentioned in HPI.  Otherwise, all other systems were negative. Constitutional: negative other than that listed in the HPI Eyes: negative other than that listed in the HPI Ears, nose, mouth, throat, and face: negative other than that listed in the HPI Respiratory: negative other than that listed in the HPI Cardiovascular: negative other than that listed in the HPI Gastrointestinal: negative other than that listed in the HPI Genitourinary: negative other than that listed in the HPI Integument: negative other than that listed in the HPI Hematologic: negative other than that listed in the HPI Musculoskeletal: negative other than that listed in the HPI Neurological: negative other than that listed in the HPI Allergy/Immunologic: negative other than that listed in the HPI    Objective:   Blood pressure (!) 142/94, pulse 72, resp. rate 16. There is no height or weight on file to calculate BMI.   Physical Exam:  General: Alert, interactive, in no acute distress. Pleasant female.  Eyes: Conjunctival injection on the right with limbal sparing, Conjunctival injection on the left with limbal sparing, PERRL bilaterally, No discharge on the right and No discharge on the left Ears: Right TM pearly gray with normal light reflex, Left TM pearly gray with normal light reflex, Right TM intact without perforation and Left TM intact without perforation.  Nose/Throat: External nose within normal limits and septum midline, turbinates edematous and pale with clear discharge, post-pharynx mildly erythematous without cobblestoning in the posterior oropharynx. Tonsils 2+ without  exudates Neck: Supple without thyromegaly. Lungs: Clear to auscultation without wheezing, rhonchi or rales. No increased work of breathing. CV: Normal S1/S2, no murmurs. Capillary refill <2 seconds.  Skin: Warm and dry, without lesions or rashes. Neuro:   Grossly intact. No focal deficits appreciated. Responsive to questions.   Diagnostic studies:  Spirometry: results normal (FEV1: 2.64/100%, FVC: 2.91/94%, FEV1/FVC: 90%).    Spirometry consistent with normal pattern.  Allergy Studies: none     Malachi Bonds, MD [facility] [place]

## 2017-03-14 NOTE — Patient Instructions (Addendum)
1. Moderate persistent asthma - with worsening symptoms due to allergies versus GERD - Lung testing today looked perfect. - We will not make any medication changes at this time. - Daily controller medication(s): Symbicort 160/4.5 two puffs twice daily with spacer + Singulair  daily - Rescue medications: ProAir 4 puffs every 4-6 hours as needed - Asthma control goals:  * Full participation in all desired activities (may need albuterol before activity) * Albuterol use two time or less a week on average (not counting use with activity) * Cough interfering with sleep two time or less a month * Oral steroids no more than once a year * No hospitalizations  2. GERD - This might be worsening your asthma. - Samples of Dexilant  provided.   3. Chronic allergic rhinitis  - Continue with Xyzal  daily. - Continue with Dymista one spray per nostril twice daily. - Consider allergy shots in the future for more long-term control.  4. Return in about 6 months (around 09/13/2017).  Please inform us of any Emergency Department visits, hospitalizations, or changes in symptoms. Call us before going to the ED for breathing or allergy symptoms since we might be able to fit you in for a sick visit. Feel free to contact us anytime with any questions, problems, or concerns.  It was a pleasure to see you again today! Happy spring!   Websites that have reliable patient information: 1. American Academy of Asthma, Allergy, and Immunology: www.aaaai.org 2. Food Allergy Research and Education (FARE): foodallergy.org 3. Mothers of Asthmatics: http://www.asthmacommunitynetwork.org 4. American College of Allergy, Asthma, and Immunology: www.acaai.org

## 2017-04-06 ENCOUNTER — Encounter: Payer: Self-pay | Admitting: *Deleted

## 2017-05-21 ENCOUNTER — Other Ambulatory Visit: Payer: Self-pay | Admitting: Allergy & Immunology

## 2017-06-04 ENCOUNTER — Other Ambulatory Visit: Payer: Self-pay | Admitting: Allergy & Immunology

## 2017-07-28 ENCOUNTER — Telehealth: Payer: Self-pay | Admitting: Family Medicine

## 2017-07-28 ENCOUNTER — Ambulatory Visit: Payer: BLUE CROSS/BLUE SHIELD | Admitting: Family Medicine

## 2017-07-28 DIAGNOSIS — E785 Hyperlipidemia, unspecified: Secondary | ICD-10-CM

## 2017-07-28 NOTE — Telephone Encounter (Signed)
Patient had lipid, liver and met 7 in 01/2017

## 2017-07-28 NOTE — Telephone Encounter (Signed)
Requesting orders for labs. She has an appointment on 09/01/17 with Dr. Brett Canales.

## 2017-07-30 NOTE — Telephone Encounter (Signed)
Lip liv 

## 2017-07-31 NOTE — Telephone Encounter (Signed)
Left message return call 07/31/17. (labs ordered) 

## 2017-08-25 LAB — HEPATIC FUNCTION PANEL
ALBUMIN: 4.5 g/dL (ref 3.5–5.5)
ALK PHOS: 50 IU/L (ref 39–117)
ALT: 20 IU/L (ref 0–32)
AST: 18 IU/L (ref 0–40)
BILIRUBIN TOTAL: 0.4 mg/dL (ref 0.0–1.2)
Bilirubin, Direct: 0.13 mg/dL (ref 0.00–0.40)
TOTAL PROTEIN: 6.7 g/dL (ref 6.0–8.5)

## 2017-08-25 LAB — LIPID PANEL
Chol/HDL Ratio: 3.9 ratio (ref 0.0–4.4)
Cholesterol, Total: 225 mg/dL — ABNORMAL HIGH (ref 100–199)
HDL: 57 mg/dL (ref >39)
LDL CALC: 119 mg/dL — AB (ref 0–99)
TRIGLYCERIDES: 243 mg/dL — AB (ref 0–149)
VLDL CHOLESTEROL CAL: 49 mg/dL — AB (ref 5–40)

## 2017-09-01 ENCOUNTER — Ambulatory Visit (INDEPENDENT_AMBULATORY_CARE_PROVIDER_SITE_OTHER): Payer: Commercial Managed Care - PPO | Admitting: Family Medicine

## 2017-09-01 ENCOUNTER — Encounter: Payer: Self-pay | Admitting: Family Medicine

## 2017-09-01 VITALS — BP 118/68 | Ht 67.0 in | Wt 185.0 lb

## 2017-09-01 DIAGNOSIS — Z23 Encounter for immunization: Secondary | ICD-10-CM | POA: Diagnosis not present

## 2017-09-01 DIAGNOSIS — E7800 Pure hypercholesterolemia, unspecified: Secondary | ICD-10-CM

## 2017-09-01 DIAGNOSIS — E785 Hyperlipidemia, unspecified: Secondary | ICD-10-CM | POA: Diagnosis not present

## 2017-09-01 DIAGNOSIS — I10 Essential (primary) hypertension: Secondary | ICD-10-CM | POA: Diagnosis not present

## 2017-09-01 DIAGNOSIS — J4540 Moderate persistent asthma, uncomplicated: Secondary | ICD-10-CM

## 2017-09-01 MED ORDER — AZELASTINE-FLUTICASONE 137-50 MCG/ACT NA SUSP: 2.0000 | NASAL | 5 refills | Status: DC

## 2017-09-01 MED ORDER — LEVOCETIRIZINE DIHYDROCHLORIDE 5 MG PO TABS: 5.0000 mg | ORAL_TABLET | Freq: Every evening | ORAL | 5 refills | Status: DC

## 2017-09-01 MED ORDER — MONTELUKAST SODIUM 10 MG PO TABS: 10.0000 mg | ORAL_TABLET | Freq: Every day | ORAL | 1 refills | Status: DC

## 2017-09-01 MED ORDER — INDAPAMIDE 2.5 MG PO TABS: 2.5000 mg | ORAL_TABLET | Freq: Every day | ORAL | 1 refills | Status: DC

## 2017-09-01 MED ORDER — BUDESONIDE-FORMOTEROL FUMARATE 160-4.5 MCG/ACT IN AERO: 2.0000 | INHALATION_SPRAY | Freq: Two times a day (BID) | RESPIRATORY_TRACT | 5 refills | Status: DC

## 2017-09-01 MED ORDER — PRAVASTATIN SODIUM 20 MG PO TABS: 20.0000 mg | ORAL_TABLET | Freq: Every day | ORAL | 1 refills | Status: DC

## 2017-09-01 MED ORDER — ENALAPRIL MALEATE 10 MG PO TABS: 10.0000 mg | ORAL_TABLET | Freq: Every day | ORAL | 1 refills | Status: DC

## 2017-09-01 NOTE — Progress Notes (Signed)
   Subjective:    Patient ID: Renee Berger, female    DOB: 07-31-1961, 56 y.o.   MRN: [id number]  Hyperlipidemia  This is a chronic problem. The current episode started more than 1 year ago. Treatments tried: pravachol. There are no compliance problems.  Risk factors for coronary artery disease include dyslipidemia, hypertension and post-menopausal.   Patient states she has to take calcium and it constipates her and is wondering about alternatives.  Results for orders placed or performed in visit on 07/28/17  Lipid panel  Result Value Ref Range   Cholesterol, Total 225 (H) 100 - 199 mg/dL   Triglycerides 829 (H) 0 - 149 mg/dL   HDL 57 >56 mg/dL   VLDL Cholesterol Cal 49 (H) 5 - 40 mg/dL   LDL Calculated 213 (H) 0 - 99 mg/dL   Chol/HDL Ratio 3.9 0.0 - 4.4 ratio  Hepatic function panel  Result Value Ref Range   Total Protein 6.7 6.0 - 8.5 g/dL   Albumin 4.5 3.5 - 5.5 g/dL   Bilirubin Total 0.4 0.0 - 1.2 mg/dL   Bilirubin, Direct 0.86 0.00 - 0.40 mg/dL   Alkaline Phosphatase 50 39 - 117 IU/L   AST 18 0 - 40 IU/L   ALT 20 0 - 32 IU/L   Now on doxy for rosacea  occas uses inhaler, has had a couple attacks but managed herself.  Once per day or so,   Blood pressure medicine and blood pressure levels reviewed today with patient. Compliant with blood pressure medicine. States does not miss a dose. No obvious side effects. Blood pressure generally good when checked elsewhere. Watching salt intake.   Patient continues to take lipid medication regularly. No obvious side effects from it. Generally does not miss a dose. Prior blood work results are reviewed with patient. Patient continues to work on fat intake in diet  Driving a lot, exercise has gone down hill, gained some weight fairly quickly   Pt has sig constip when taking calcium and vit d supp  Dairy prod  Patient has history of chronic asthma. Compliant with preventive medicine. Generally does not miss a dose. Notes overall it  helps decrease the amount rescue inhalers. No major flare since last visit. Some challenges with fall allergies  Review of Systems No headache, no major weight loss or weight gain, no chest pain no back pain abdominal pain no change in bowel habits complete ROS otherwise negative     Objective:   Physical Exam  Alert and oriented, vitals reviewed and stable, NAD ENT-TM's and ext canals WNL bilat via otoscopic exam Soft palate, tonsils and post pharynx WNL via oropharyngeal exam Neck-symmetric, no masses; thyroid nonpalpable and nontender Pulmonary-no tachypnea or accessory muscle use; Clear without wheezes via auscultation Card--no abnrml murmurs, rhythm reg and rate WNL Carotid pulses symmetric, without bruits        Assessment & Plan:  Impression 1 hypertension good control discussed maintain same meds  #2 hyperlipidemia prior blood work reviewed discussed maintain same meds. Further recommendations discussed with current LDL 118 and therefore satisfactory  #3 asthma clinically stable. Importance of compliance discussed. Preventive measures discussed. Recommend flu shot today. Plan all medications refilled. Diet exercise discussed. May cut down to just dairy products and avoid calcium. Would encourage her to take a vitamin D tablet supplement daily vitamin D3 2000 milliunits discussed. Follow-up in 6 months

## 2017-09-03 DIAGNOSIS — J45909 Unspecified asthma, uncomplicated: Secondary | ICD-10-CM | POA: Insufficient documentation

## 2017-09-12 ENCOUNTER — Encounter: Payer: Self-pay | Admitting: Allergy & Immunology

## 2017-09-12 ENCOUNTER — Ambulatory Visit (INDEPENDENT_AMBULATORY_CARE_PROVIDER_SITE_OTHER): Payer: Commercial Managed Care - PPO | Admitting: Allergy & Immunology

## 2017-09-12 VITALS — BP 128/78 | HR 74 | Resp 16

## 2017-09-12 DIAGNOSIS — J4540 Moderate persistent asthma, uncomplicated: Secondary | ICD-10-CM | POA: Diagnosis not present

## 2017-09-12 DIAGNOSIS — J3081 Allergic rhinitis due to animal (cat) (dog) hair and dander: Secondary | ICD-10-CM

## 2017-09-12 DIAGNOSIS — K219 Gastro-esophageal reflux disease without esophagitis: Secondary | ICD-10-CM

## 2017-09-12 MED ORDER — ALBUTEROL SULFATE HFA 108 (90 BASE) MCG/ACT IN AERS: 2.0000 | INHALATION_SPRAY | Freq: Four times a day (QID) | RESPIRATORY_TRACT | 1 refills | Status: DC | PRN

## 2017-09-12 MED ORDER — MONTELUKAST SODIUM 10 MG PO TABS: 10.0000 mg | ORAL_TABLET | Freq: Every day | ORAL | 5 refills | Status: AC

## 2017-09-12 MED ORDER — BUDESONIDE-FORMOTEROL FUMARATE 160-4.5 MCG/ACT IN AERO: 2.0000 | INHALATION_SPRAY | Freq: Two times a day (BID) | RESPIRATORY_TRACT | 5 refills | Status: DC

## 2017-09-12 NOTE — Progress Notes (Signed)
FOLLOW UP  Date of Service/Encounter:  09/12/17   Assessment:   Moderate persistent asthma, uncomplicated  Chronic allergic rhinitis (dog, cockroach, molds, dust mite)  Gastroesophageal reflux disease   Asthma Reportables:  Severity: moderate persistent  Risk: low Control: well controlled  Plan/Recommendations:   1. Moderate persistent asthma, uncomplicated - Lung testing today looked perfect. - We will not make any medication changes at this time. - Daily controller medication(s): Symbicort 160/4.5 1-2 puffs twice daily with spacer + Singulair  daily - Rescue medications: ProAir 4 puffs every 4-6 hours as needed - Asthma control goals:  * Full participation in all desired activities (may need albuterol before activity) * Albuterol use two time or less a week on average (not counting use with activity) * Cough interfering with sleep two time or less a month * Oral steroids no more than once a year * No hospitalizations  2. Chronic allergic rhinitis  - Continue with Zyrtec  daily. - Continue with Dymista one spray per nostril twice daily as needed.  - Consider allergy shots in the future for more long-term control.  3. Return in about 6 months (around 03/13/2018).   Subjective:   Renee Berger is a 56 y.o. female presenting today for follow up of  Chief Complaint  Patient presents with  . Asthma  . Medication Management    Needs refills    Renee Berger has a history of the following: Patient Active Problem List   Diagnosis Date Noted  . Asthma 09/03/2017  . Hyperlipidemia LDL goal <130 01/06/2015  . Reactive airways dysfunction syndrome (HCC) 06/30/2013  . Essential hypertension, benign 06/23/2013  . Allergic rhinitis 06/23/2013    History obtained from: chart review and patient.  Renee Berger Primary Care Provider is Merlyn Albert, MD.     Teneka is a 56 y.o. female presenting for a follow up visit. She was last seen in April 2018  at which time she was having worsening asthma symptoms secondary to allergies and GERD. We continued her on Symbicort 160/4.5 two puffs twice daily as well as Singulair  daily. We gave her samples of Dexilant  once daily. We continued her on Xyzal  daily as well as Dymista 1-2 sprays per nostril twice daily. Of note, she has eight dogs at home.   Since the last visit, she has done well. She will occasionally use her rescue inhaler around twice per week. Overall it has been better over the last two weeks. The heat has been rather bad for her. She is only using her Symbicort one puff twice daily as well as Singulair , which seems to do the trick. The last time that she had a breathing attack was when her carpeting was changed to linoleum. Otherwise, Renee Berger's asthma has been well controlled. She has not required rescue medication, experienced nocturnal awakenings due to lower respiratory symptoms, nor have activities of daily living been limited. She has required no Emergency Department or Urgent Care visits for her asthma. She has required zero courses of systemic steroids for asthma exacerbations since the last visit. ACT score today is 20, indicating excellent asthma symptom control. She has had no episodes of bronchitis since the last visit.   She remains on Zyrtec since she is able to get that generic at cheaper prices. She does not really the nose sprays. She took the Dexilant samples, but is now using Tums which seems to control the symptoms.   She is on doxycycline for  ocular roasea; she has been on this for three months. Prior to this, she was on Restasis and prednisone. Ophathmologist is Dr. Charise Killian at [facility] here in [place]. She sees Dr. Orvan Falconer for Dermatology.   Otherwise, there have been no changes to her past medical history, surgical history, family history, or social history. Her insurance has changed recently. Thankfully, she does have the Symbicort coupons and we  provided her sample. She is currently not paying any premiums for her insurance at this time (retired from [employer]), but it does not appear that her prescription coverage is very good. 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     Review of Systems: a 14-point review of systems is pertinent for what is mentioned in HPI.  Otherwise, all other systems were negative. Constitutional: negative other than that listed in the HPI Eyes: negative other than that listed in the HPI Ears, nose, mouth, throat, and face: negative other than that listed in the HPI Respiratory: negative other than that listed in the HPI Cardiovascular: negative other than that listed in the HPI Gastrointestinal: negative other than that listed in the HPI Genitourinary: negative other than that listed in the HPI Integument: negative other than that listed in the HPI Hematologic: negative other than that listed in the HPI Musculoskeletal: negative other than that listed in the HPI Neurological: negative other than that listed in the HPI Allergy/Immunologic: negative other than that listed in the HPI    Objective:   Blood pressure 128/78, pulse 74, resp. rate 16, SpO2 97 %. There is no height or weight on file to calculate BMI.   Physical Exam:  General: Alert, interactive, in no acute distress. Pleasant female.  Eyes: No conjunctival injection present on the right, No conjunctival injection present on the left, PERRL bilaterally, No discharge on the right, No discharge on the left and No Horner-Trantas dots present Ears: Right TM pearly gray with normal light reflex, Left TM pearly gray with normal light reflex, Right TM intact without perforation and Left TM intact without perforation.  Nose/Throat: External nose within normal limits and septum  midline, turbinates edematous and pale with clear discharge, post-pharynx erythematous without cobblestoning in the posterior oropharynx. Tonsils 2+ without exudates Neck: Supple without thyromegaly. Lungs: Clear to auscultation without wheezing, rhonchi or rales. No increased work of breathing. CV: Normal S1/S2, no murmurs. Capillary refill <2 seconds.  Skin: Warm and dry, without lesions or rashes. Neuro:   Grossly intact. No focal deficits appreciated. Responsive to questions.   Diagnostic studies:   Spirometry: results normal (FEV1: 2.33/89%, FVC: 3.35/108%, FEV1/FVC: 69%).    Spirometry consistent with normal pattern.   Allergy Studies: none      Malachi Bonds, MD [facility]

## 2017-09-12 NOTE — Patient Instructions (Addendum)
1. Moderate persistent asthma, uncomplicated - Lung testing today looked perfect. - We will not make any medication changes at this time. - Daily controller medication(s): Symbicort 160/4.5 1-2 puffs twice daily with spacer + Singulair  daily - Rescue medications: ProAir 4 puffs every 4-6 hours as needed - Asthma control goals:  * Full participation in all desired activities (may need albuterol before activity) * Albuterol use two time or less a week on average (not counting use with activity) * Cough interfering with sleep two time or less a month * Oral steroids no more than once a year * No hospitalizations  2. Chronic allergic rhinitis  - Continue with Zyrtec  daily. - Continue with Dymista one spray per nostril twice daily as needed.  - Consider allergy shots in the future for more long-term control.  3. Return in about 6 months (around 03/13/2018).   Please inform us of any Emergency Department visits, hospitalizations, or changes in symptoms. Call us before going to the ED for breathing or allergy symptoms since we might be able to fit you in for a sick visit. Feel free to contact us anytime with any questions, problems, or concerns.  It was a pleasure to see you again today! Enjoy the upcoming fall season! Have a safe trip to [facility]!   Websites that have reliable patient information: 1. American Academy of Asthma, Allergy, and Immunology: www.aaaai.org 2. Food Allergy Research and Education (FARE): foodallergy.org 3. Mothers of Asthmatics: http://www.asthmacommunitynetwork.org 4. American College of Allergy, Asthma, and Immunology: www.acaai.org   Election Day is coming up on Tuesday, November 6th! Make your voice heard! Register to vote at vote.org!

## 2017-12-27 ENCOUNTER — Other Ambulatory Visit: Payer: Self-pay | Admitting: Family Medicine

## 2018-03-01 ENCOUNTER — Encounter: Payer: Self-pay | Admitting: Family Medicine

## 2018-03-01 ENCOUNTER — Ambulatory Visit (INDEPENDENT_AMBULATORY_CARE_PROVIDER_SITE_OTHER): Payer: Commercial Managed Care - PPO | Admitting: Family Medicine

## 2018-03-01 VITALS — BP 126/70 | Ht 67.0 in | Wt 191.0 lb

## 2018-03-01 DIAGNOSIS — E785 Hyperlipidemia, unspecified: Secondary | ICD-10-CM

## 2018-03-01 DIAGNOSIS — E119 Type 2 diabetes mellitus without complications: Secondary | ICD-10-CM | POA: Diagnosis not present

## 2018-03-01 DIAGNOSIS — E7800 Pure hypercholesterolemia, unspecified: Secondary | ICD-10-CM

## 2018-03-01 DIAGNOSIS — Z79899 Other long term (current) drug therapy: Secondary | ICD-10-CM | POA: Diagnosis not present

## 2018-03-01 DIAGNOSIS — I10 Essential (primary) hypertension: Secondary | ICD-10-CM

## 2018-03-01 MED ORDER — ENALAPRIL MALEATE 10 MG PO TABS: 10.0000 mg | ORAL_TABLET | Freq: Every day | ORAL | 1 refills | Status: DC

## 2018-03-01 MED ORDER — BUDESONIDE-FORMOTEROL FUMARATE 160-4.5 MCG/ACT IN AERO: 2.0000 | INHALATION_SPRAY | Freq: Two times a day (BID) | RESPIRATORY_TRACT | 5 refills | Status: DC

## 2018-03-01 MED ORDER — ALBUTEROL SULFATE HFA 108 (90 BASE) MCG/ACT IN AERS: 2.0000 | INHALATION_SPRAY | Freq: Four times a day (QID) | RESPIRATORY_TRACT | 1 refills | Status: DC | PRN

## 2018-03-01 NOTE — Progress Notes (Signed)
   Subjective:    Patient ID: Renee Berger, female    DOB: Jun 10, 1961, 57 y.o.   MRN: [id number]  HPI  Patient is here today to follow up on Chronic health concerns, one being Htn She is taking enalapril 10 mg daily for this and also here to follow up on Hyperlipidemia in which she takes Pravastatin 20 mg. She states she eats healthy and gets some exercise  .She sees Dr.Shaw an eye Dr.for her chronic dry eye     and also Dr.Beaver a dermatologist.  Blood pressure medicine and blood pressure levels reviewed today with patient. Compliant with blood pressure medicine. States does not miss a dose. No obvious side effects. Blood pressure generally good when checked elsewhere. Watching salt intake.   Patient continues to take lipid medication regularly. No obvious side effects from it. Generally does not miss a dose. Prior blood work results are reviewed with patient. Patient continues to work on fat intake in diet  Patient now wants us to manage her chronic asthma.  States Symbicort does well.  Uses faithfully.  Only requiring rescue inhaler once every couple weeks or so.  Sometimes does get worse with allergies  Review of Systems No headache, no major weight loss or weight gain, no chest pain no back pain abdominal pain no change in bowel habits complete ROS otherwise negative     Objective:   Physical Exam  Alert and oriented, vitals reviewed and stable, NAD ENT-TM's and ext canals WNL bilat via otoscopic exam Soft palate, tonsils and post pharynx WNL via oropharyngeal exam Neck-symmetric, no masses; thyroid nonpalpable and nontender Pulmonary-no tachypnea or accessory muscle use; Clear without wheezes via auscultation Card--no abnrml murmurs, rhythm reg and rate WNL Carotid pulses symmetric, without bruits       Assessment & Plan:  Impression 1 hypertension good control discussed to maintain same meds diet exercise discussed  2.  Hyperlipidemia.  Prior blood work reviewed.   Compliance discussed medications maintain same dose appropriate blood work to assess  3.  Chronic asthma good control of symptoms obvious discussed side effects benefits discussed  Greater than 50% of this 25 minute face to face visit was spent in counseling and discussion and coordination of care regarding the above diagnosis/diagnosies\

## 2018-03-02 LAB — LIPID PANEL
CHOL/HDL RATIO: 3.8 ratio (ref 0.0–4.4)
CHOLESTEROL TOTAL: 211 mg/dL — AB (ref 100–199)
HDL: 56 mg/dL (ref >39)
LDL Calculated: 121 mg/dL — ABNORMAL HIGH (ref 0–99)
TRIGLYCERIDES: 171 mg/dL — AB (ref 0–149)
VLDL Cholesterol Cal: 34 mg/dL (ref 5–40)

## 2018-03-02 LAB — HEPATIC FUNCTION PANEL
ALK PHOS: 54 IU/L (ref 39–117)
ALT: 22 IU/L (ref 0–32)
AST: 17 IU/L (ref 0–40)
Albumin: 4.4 g/dL (ref 3.5–5.5)
Bilirubin Total: 0.3 mg/dL (ref 0.0–1.2)
Bilirubin, Direct: 0.08 mg/dL (ref 0.00–0.40)
Total Protein: 6.5 g/dL (ref 6.0–8.5)

## 2018-03-02 LAB — BASIC METABOLIC PANEL
BUN / CREAT RATIO: 24 — AB (ref 9–23)
BUN: 22 mg/dL (ref 6–24)
CO2: 22 mmol/L (ref 20–29)
CREATININE: 0.9 mg/dL (ref 0.57–1.00)
Calcium: 9.6 mg/dL (ref 8.7–10.2)
Chloride: 101 mmol/L (ref 96–106)
GFR calc Af Amer: 81 mL/min/{1.73_m2} (ref >59)
GFR, EST NON AFRICAN AMERICAN: 70 mL/min/{1.73_m2} (ref >59)
GLUCOSE: 97 mg/dL (ref 65–99)
Potassium: 4 mmol/L (ref 3.5–5.2)
SODIUM: 142 mmol/L (ref 134–144)

## 2018-03-04 ENCOUNTER — Encounter: Payer: Self-pay | Admitting: Family Medicine

## 2018-03-13 ENCOUNTER — Ambulatory Visit: Payer: Commercial Managed Care - PPO | Admitting: Allergy & Immunology

## 2018-03-25 ENCOUNTER — Other Ambulatory Visit: Payer: Self-pay | Admitting: Family Medicine

## 2018-06-06 ENCOUNTER — Other Ambulatory Visit: Payer: Self-pay | Admitting: Family Medicine

## 2018-07-07 ENCOUNTER — Other Ambulatory Visit: Payer: Self-pay | Admitting: Family Medicine

## 2018-08-31 ENCOUNTER — Ambulatory Visit (INDEPENDENT_AMBULATORY_CARE_PROVIDER_SITE_OTHER): Payer: Commercial Managed Care - PPO | Admitting: Family Medicine

## 2018-08-31 ENCOUNTER — Encounter: Payer: Self-pay | Admitting: Family Medicine

## 2018-08-31 VITALS — BP 130/74 | Ht 67.0 in | Wt 194.6 lb

## 2018-08-31 DIAGNOSIS — R5383 Other fatigue: Secondary | ICD-10-CM | POA: Diagnosis not present

## 2018-08-31 DIAGNOSIS — J683 Other acute and subacute respiratory conditions due to chemicals, gases, fumes and vapors: Secondary | ICD-10-CM | POA: Diagnosis not present

## 2018-08-31 DIAGNOSIS — I10 Essential (primary) hypertension: Secondary | ICD-10-CM

## 2018-08-31 DIAGNOSIS — E785 Hyperlipidemia, unspecified: Secondary | ICD-10-CM

## 2018-08-31 DIAGNOSIS — Z23 Encounter for immunization: Secondary | ICD-10-CM | POA: Diagnosis not present

## 2018-08-31 MED ORDER — VENTOLIN HFA 108 (90 BASE) MCG/ACT IN AERS: INHALATION_SPRAY | RESPIRATORY_TRACT | 5 refills | Status: AC

## 2018-08-31 MED ORDER — ENALAPRIL MALEATE 10 MG PO TABS: 10.0000 mg | ORAL_TABLET | Freq: Every day | ORAL | 1 refills | Status: DC

## 2018-08-31 MED ORDER — INDAPAMIDE 2.5 MG PO TABS: 2.5000 mg | ORAL_TABLET | Freq: Every day | ORAL | 1 refills | Status: DC

## 2018-08-31 MED ORDER — BUDESONIDE-FORMOTEROL FUMARATE 160-4.5 MCG/ACT IN AERO: 2.0000 | INHALATION_SPRAY | Freq: Two times a day (BID) | RESPIRATORY_TRACT | 5 refills | Status: DC

## 2018-08-31 MED ORDER — PRAVASTATIN SODIUM 20 MG PO TABS: 20.0000 mg | ORAL_TABLET | Freq: Every day | ORAL | 1 refills | Status: DC

## 2018-08-31 NOTE — Progress Notes (Signed)
   Subjective:    Patient ID: Renee Berger, female    DOB: Mar 21, 1961, 57 y.o.   MRN: [id number] Patient arrives with numerous concerns HPI Pt here for 6 month follow up. Pt has a medical review form that needs to be filled out due patient being a Geologist, engineeringTeacher Assistant. Pt has a bump on left side of head; pt states provider removed it some time ago.   Blood pressure medicine and blood pressure levels reviewed today with patient. Compliant with blood pressure medicine. States does not miss a dose. No obvious side effects. Blood pressure generally good when checked elsewhere. Watching salt intake.   Patient continues to take lipid medication regularly. No obvious side effects from it. Generally does not miss a dose. Prior blood work results are reviewed with patient. 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 overll mangeable, flared slightly of late.  Flu shot due today        Review of Systems No headache, no major weight loss or weight gain, no chest pain no back pain abdominal pain no change in bowel habits complete ROS otherwise negative     Objective:   Physical Exam Alert and oriented, vitals reviewed and stable, NAD ENT-TM's and ext canals WNL bilat via otoscopic exam Soft palate, tonsils and post pharynx WNL via oropharyngeal exam Neck-symmetric, no masses; thyroid nonpalpable and nontender Pulmonary-no tachypnea or accessory muscle use; Clear without wheezes via auscultation Card--no abnrml murmurs, rhythm reg and rate WNL Carotid pulses symmetric, without bruits        Assessment & Plan:  Impression 1 hypertension.  Good control discussed to maintain same meds diet exercise discussed  2.  Hyperlipidemia blood work  reviewed.  Compliance discussed diet discussed to maintain same meds  3.  As chronic active/persistent asthma.  Approximately 2 rescue inhalers per week.  Long discussion.  Flu shot today.  Maintain maintenance medicines proper use discussed.  4.  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  States commonly does this.  We will fill out her form  Follow-up in 6 months  25 .

## 2018-09-04 ENCOUNTER — Telehealth: Payer: Self-pay | Admitting: Family Medicine

## 2018-09-04 NOTE — Telephone Encounter (Signed)
Called patient to inform her that forms were ready to picked up. Pt verbalized understanding.

## 2018-09-06 LAB — LIPID PANEL
CHOL/HDL RATIO: 5.4 ratio — AB (ref 0.0–4.4)
Cholesterol, Total: 227 mg/dL — ABNORMAL HIGH (ref 100–199)
HDL: 42 mg/dL (ref >39)
LDL Calculated: 132 mg/dL — ABNORMAL HIGH (ref 0–99)
Triglycerides: 263 mg/dL — ABNORMAL HIGH (ref 0–149)
VLDL CHOLESTEROL CAL: 53 mg/dL — AB (ref 5–40)

## 2018-09-06 LAB — HEPATIC FUNCTION PANEL
ALBUMIN: 4.4 g/dL (ref 3.5–5.5)
ALK PHOS: 58 IU/L (ref 39–117)
ALT: 20 IU/L (ref 0–32)
AST: 14 IU/L (ref 0–40)
Bilirubin Total: 0.4 mg/dL (ref 0.0–1.2)
Bilirubin, Direct: 0.1 mg/dL (ref 0.00–0.40)
TOTAL PROTEIN: 6.2 g/dL (ref 6.0–8.5)

## 2018-09-06 LAB — TSH: TSH: 5 u[IU]/mL — AB (ref 0.450–4.500)

## 2018-09-10 ENCOUNTER — Other Ambulatory Visit: Payer: Self-pay | Admitting: *Deleted

## 2018-09-10 DIAGNOSIS — R7989 Other specified abnormal findings of blood chemistry: Secondary | ICD-10-CM

## 2018-09-10 MED ORDER — PRAVASTATIN SODIUM 40 MG PO TABS: 40.0000 mg | ORAL_TABLET | Freq: Every day | ORAL | 1 refills | Status: DC

## 2018-10-21 ENCOUNTER — Other Ambulatory Visit: Payer: Self-pay | Admitting: Family Medicine

## 2019-03-01 ENCOUNTER — Ambulatory Visit: Payer: Commercial Managed Care - PPO | Admitting: Family Medicine

## 2019-04-27 ENCOUNTER — Other Ambulatory Visit: Payer: Self-pay | Admitting: Family Medicine

## 2019-04-29 NOTE — Telephone Encounter (Signed)
Last seen for wellness 08/31/2018

## 2019-04-29 NOTE — Telephone Encounter (Signed)
Three mo late on 6 mo ck up, plz sched, then may ef times one

## 2019-05-01 NOTE — Telephone Encounter (Signed)
It appears her March 20th appt has been canceled due to her moving her records to a new doc office.

## 2019-05-01 NOTE — Telephone Encounter (Signed)
Yes lets not do refills

## 2019-09-24 ENCOUNTER — Other Ambulatory Visit: Payer: Self-pay

## 2019-09-24 DIAGNOSIS — Z20822 Contact with and (suspected) exposure to covid-19: Secondary | ICD-10-CM

## 2019-09-26 LAB — NOVEL CORONAVIRUS, NAA: SARS-CoV-2, NAA: NOT DETECTED

## 2020-09-17 ENCOUNTER — Ambulatory Visit: Payer: Self-pay | Attending: Internal Medicine

## 2020-09-17 DIAGNOSIS — Z23 Encounter for immunization: Secondary | ICD-10-CM

## 2020-09-17 NOTE — Progress Notes (Signed)
   Covid-19 Vaccination Clinic  Name:  Renee Berger    MRN: [id number] DOB: 11/17/1961  09/17/2020  Ms. Renee Berger was observed post Covid-19 immunization for 15 minutes without incident. She was provided with Vaccine Information Sheet and instruction to access the V-Safe system.   Ms. Renee Berger was instructed to call 911 with any severe reactions post vaccine: Marland Kitchen Difficulty breathing  . Swelling of face and throat  . A fast heartbeat  . A bad rash all over body  . Dizziness and weakness

## 2023-03-17 ENCOUNTER — Other Ambulatory Visit (HOSPITAL_COMMUNITY): Payer: Self-pay | Admitting: Family Medicine

## 2023-03-17 DIAGNOSIS — R748 Abnormal levels of other serum enzymes: Secondary | ICD-10-CM

## 2023-03-29 ENCOUNTER — Ambulatory Visit (HOSPITAL_COMMUNITY)
Admission: RE | Admit: 2023-03-29 | Discharge: 2023-03-29 | Disposition: A | Discharge status: Home / Self Care | Payer: Commercial Managed Care - PPO | Source: Ambulatory Visit | Attending: Family Medicine | Admitting: Family Medicine

## 2023-03-29 DIAGNOSIS — R748 Abnormal levels of other serum enzymes: Secondary | ICD-10-CM | POA: Insufficient documentation

## 2023-04-05 ENCOUNTER — Encounter (INDEPENDENT_AMBULATORY_CARE_PROVIDER_SITE_OTHER): Payer: Self-pay | Admitting: *Deleted

## 2023-04-24 ENCOUNTER — Encounter (INDEPENDENT_AMBULATORY_CARE_PROVIDER_SITE_OTHER): Payer: Self-pay | Admitting: Gastroenterology

## 2023-04-24 ENCOUNTER — Ambulatory Visit (INDEPENDENT_AMBULATORY_CARE_PROVIDER_SITE_OTHER): Payer: Commercial Managed Care - PPO | Admitting: Gastroenterology

## 2023-04-24 VITALS — BP 116/71 | HR 82 | Temp 97.5°F | Ht 67.0 in | Wt 178.6 lb

## 2023-04-24 DIAGNOSIS — R7989 Other specified abnormal findings of blood chemistry: Secondary | ICD-10-CM | POA: Diagnosis not present

## 2023-04-24 DIAGNOSIS — K5903 Drug induced constipation: Secondary | ICD-10-CM

## 2023-04-24 DIAGNOSIS — K5900 Constipation, unspecified: Secondary | ICD-10-CM | POA: Insufficient documentation

## 2023-04-24 DIAGNOSIS — K760 Fatty (change of) liver, not elsewhere classified: Secondary | ICD-10-CM | POA: Diagnosis not present

## 2023-04-24 NOTE — Patient Instructions (Signed)
Perform blood workup in 3 months Continue with Miralax 1 capful daily  Continue with dietary changes to achieve weight loss

## 2023-04-24 NOTE — Progress Notes (Signed)
Renee Berger, M.D. Gastroenterology & Hepatology [facility] Gastroenterology [address] [place], [place] [address] Primary Care Physician: Ladon Applebaum [address] [place] [address]  Referring MD: PCP  Chief Complaint: Elevated LFTs and constipation  History of Present Illness: Renee Berger is a 62 y.o. female with past medical history of hypertension, diabetes, asthma, who presents for evaluation of elevated LFTs and constipation.  Patient reports that she has had a history of constipation. She states that Ozempic can cause some constipation occasionally, which she has managed with Miralax daily.  Denies having any other issues. The patient denies having any nausea, vomiting, fever, chills, hematochezia, melena, hematemesis, abdominal distention, abdominal pain, diarrhea, jaundice, pruritus. Has lost 14 lb since starting Ozempic.  Patient was referred after being found to have elevated aminotransferases.  Patient states she was on a cruise before she had her labs drawn and drank some alcohol in the cruise.  Patient was referred for evaluation of elevated LFTs.  Unfortunately, no labs were faxed with her referral. Most recent LFTs from 08/2018 showed normal AST 14, ALT 20, alk phos 58 TB 0.4, albumin 4.4.  Patient pulled the lab reports from her phone portal. Labs performed on 03/09/23 - CBC WBC 9.1, Hb 13.9, PLT 288, CMP BUN 19 Cr 0.84, Na 140, K 4.3, Cl 99 , albumin 4.8, ALT 33, AST 18, TB 0.4, alk phos 111, TGC 312 TC 238.  US of the abdomen was performed 03/29/2023 which showed a CBD of 7.7 mm without intrahepatic ductal dilation.  There was diffuse increased echogenicity concerning for hepatic steatosis.  Last UJW:JXBJY Last Colonoscopy: 2013 - normal, performed by Dr. Lovell Sheehan Had Cologuard 6 months - negative  FHx: neg for any gastrointestinal/liver disease, father pancreatic cancer Social: neg smoking, frequent alcohol or  illicit drug use Surgical: no abdominal surgeries  Past Medical History: Past Medical History:  Diagnosis Date   Headache(784.0)    Hypertension     Past Surgical History: Past Surgical History:  Procedure Laterality Date   COLONOSCOPY  04/24/2012   Procedure: COLONOSCOPY;  Surgeon: Dalia Heading, MD;  Location: [facility];  Service: Gastroenterology;  Laterality: N/A;    Family History: Family History  Problem Relation Age of Onset   Heart disease Mother 58       2 stents; died from MI   Asthma Mother    Cancer Father 34       pancreatic   Cancer Maternal Grandmother 75   Heart disease Maternal Grandfather    Allergic rhinitis Neg Hx    Angioedema Neg Hx    Atopy Neg Hx    Eczema Neg Hx    Immunodeficiency Neg Hx    Urticaria Neg Hx     Social History: Social History   Tobacco Use  Smoking Status Never  Smokeless Tobacco Never   Social History   Substance and Sexual Activity  Alcohol Use Yes   Comment: rare   Social History   Substance and Sexual Activity  Drug Use No    Allergies: Allergies  Allergen Reactions   Aspirin Other (See Comments)    Causes stomach pain    Medications: Current Outpatient Medications  Medication Sig Dispense Refill   albuterol (PROVENTIL) (2.5 MG/3ML) 0.083% nebulizer solution Take 2.5 mg by nebulization every 6 (six) hours as needed for wheezing or shortness of breath.     atorvastatin (LIPITOR) 40 MG tablet Take 40 mg by mouth daily.  budesonide-formoterol (SYMBICORT) 80-4.5 MCG/ACT inhaler Inhale 2 puffs into the lungs 2 (two) times daily.     cycloSPORINE (CEQUA OP) Apply to eye. One drop each eye daily.     lisinopril-hydrochlorothiazide (ZESTORETIC) 20-12.5 MG tablet Take 1 tablet by mouth daily.     montelukast (SINGULAIR) 10 MG tablet Take 1 tablet (10 mg total) by mouth at bedtime. (Patient taking differently: Take 10 mg by mouth daily at 6 (six) AM.) 30 tablet 5   Propylene Glycol (VISTA MEIBO TEARS OP)  Apply to eye. TID.     Semaglutide (OZEMPIC, 2 MG/DOSE, [place]) Inject into the skin once a week.     VENTOLIN HFA 108 (90 Base) MCG/ACT inhaler INHALE 2 PUFFS BY MOUTH EVERY 6 HOURS AS NEEDED FOR WHEEZING OR SHORTNESS OF BREATH 18 each 5   No current facility-administered medications for this visit.    Review of Systems: GENERAL: negative for malaise, night sweats HEENT: No changes in hearing or vision, no nose bleeds or other nasal problems. NECK: Negative for lumps, goiter, pain and significant neck swelling RESPIRATORY: Negative for cough, wheezing CARDIOVASCULAR: Negative for chest pain, leg swelling, palpitations, orthopnea GI: SEE HPI MUSCULOSKELETAL: Negative for joint pain or swelling, back pain, and muscle pain. SKIN: Negative for lesions, rash PSYCH: Negative for sleep disturbance, mood disorder and recent psychosocial stressors. HEMATOLOGY Negative for prolonged bleeding, bruising easily, and swollen nodes. ENDOCRINE: Negative for cold or heat intolerance, polyuria, polydipsia and goiter. NEURO: negative for tremor, gait imbalance, syncope and seizures. The remainder of the review of systems is noncontributory.   Physical Exam: BP 116/71 (BP Location: Left Arm, Patient Position: Sitting, Cuff Size: Large)   Pulse 82   Temp (!) 97.5 F (36.4 C) (Temporal)   Ht 5\' 7"  (1.702 m)   Wt 178 lb 9.6 oz (81 kg)   BMI 27.97 kg/m  GENERAL: The patient is AO x3, in no acute distress. HEENT: Head is normocephalic and atraumatic. EOMI are intact. Mouth is well hydrated and without lesions. NECK: Supple. No masses LUNGS: Clear to auscultation. No presence of rhonchi/wheezing/rales. Adequate chest expansion HEART: RRR, normal s1 and s2. ABDOMEN: Soft, nontender, no guarding, no peritoneal signs, and nondistended. BS +. No masses. EXTREMITIES: Without any cyanosis, clubbing, rash, lesions or edema. NEUROLOGIC: AOx3, no focal motor deficit. SKIN: no jaundice, no  rashes   Imaging/Labs: as above  I personally reviewed and interpreted the available labs, imaging and endoscopic files.  Impression and Plan: Renee Berger is a 62 y.o. female with past medical history of hypertension, diabetes, asthma, who presents for evaluation of elevated LFTs and constipation.  Patient had very mild elevation of her aminotransferases without any other abnormalities in her LFTs.  This could have been a transient elevation after drinking alcohol.  However, she had presence of fatty infiltration in her liver which may explain the mild elevation of the aminotransferases (related to NASH).  Due to this, we will repeat a CMP in 3 months.  Will also check viral hepatitis at the same time.  If her aminotransferases are still elevated, may consider checking for other etiologies such as autoimmune or metabolic.  I encouraged her to continue with dietary/lifestyle changes to achieve further weight loss for NASH.  Finally, she has presented some constipation that has responded to the use of MiraLAX, which she should continue for now.  -Perform blood workup in 3 months -Continue with Miralax 1 capful daily  -Continue with dietary changes to achieve weight loss  All questions  were answered.      Renee Blazing, MD Gastroenterology and Hepatology [facility] Gastroenterology

## 2023-10-26 ENCOUNTER — Ambulatory Visit (INDEPENDENT_AMBULATORY_CARE_PROVIDER_SITE_OTHER): Payer: Commercial Managed Care - PPO | Admitting: Gastroenterology

## 2024-08-19 ENCOUNTER — Telehealth

## 2024-08-19 ENCOUNTER — Encounter

## 2024-09-11 ENCOUNTER — Other Ambulatory Visit: Payer: Self-pay | Admitting: Medical Genetics

## 2024-09-12 ENCOUNTER — Other Ambulatory Visit (HOSPITAL_COMMUNITY)
Admission: RE | Admit: 2024-09-12 | Discharge: 2024-09-12 | Disposition: A | Discharge status: Home / Self Care | Payer: Self-pay | Source: Ambulatory Visit | Attending: Oncology | Admitting: Oncology

## 2024-09-22 LAB — GENECONNECT MOLECULAR SCREEN: Genetic Analysis Overall Interpretation: NEGATIVE

## 2024-09-25 ENCOUNTER — Encounter (INDEPENDENT_AMBULATORY_CARE_PROVIDER_SITE_OTHER): Payer: Self-pay | Admitting: Gastroenterology
